# Patient Record
Sex: Male | Born: 1955 | Race: White | Hispanic: No | Marital: Married | Smoking: Former smoker
Health system: Southern US, Community
[De-identification: ages and names within clinical notes are randomized; demographics above are authoritative.]

## PROBLEM LIST (undated history)

## (undated) DIAGNOSIS — M51369 Other intervertebral disc degeneration, lumbar region without mention of lumbar back pain or lower extremity pain: Secondary | ICD-10-CM

## (undated) DIAGNOSIS — M5136 Other intervertebral disc degeneration, lumbar region: Secondary | ICD-10-CM

## (undated) DIAGNOSIS — I2510 Atherosclerotic heart disease of native coronary artery without angina pectoris: Secondary | ICD-10-CM

## (undated) DIAGNOSIS — R918 Other nonspecific abnormal finding of lung field: Secondary | ICD-10-CM

## (undated) DIAGNOSIS — E785 Hyperlipidemia, unspecified: Secondary | ICD-10-CM

## (undated) HISTORY — DX: Atherosclerotic heart disease of native coronary artery without angina pectoris: I25.10

## (undated) HISTORY — PX: COLONOSCOPY: SHX174

## (undated) HISTORY — PX: LUMBAR LAMINECTOMY: SHX95

## (undated) HISTORY — PX: CARPAL TUNNEL RELEASE: SHX101

## (undated) HISTORY — PX: TONSILLECTOMY: SUR1361

---

## 2006-01-25 ENCOUNTER — Ambulatory Visit: Payer: Self-pay | Admitting: Gastroenterology

## 2008-01-09 ENCOUNTER — Emergency Department: Payer: Self-pay

## 2008-01-09 ENCOUNTER — Other Ambulatory Visit: Payer: Self-pay

## 2008-02-04 ENCOUNTER — Ambulatory Visit: Payer: Self-pay | Admitting: Internal Medicine

## 2008-02-12 ENCOUNTER — Ambulatory Visit: Payer: Self-pay | Admitting: Unknown Physician Specialty

## 2008-02-13 ENCOUNTER — Ambulatory Visit: Payer: Self-pay | Admitting: Unknown Physician Specialty

## 2009-03-13 ENCOUNTER — Ambulatory Visit: Payer: Self-pay | Admitting: Internal Medicine

## 2012-06-13 ENCOUNTER — Other Ambulatory Visit: Payer: Self-pay | Admitting: Internal Medicine

## 2012-06-17 ENCOUNTER — Ambulatory Visit: Payer: Self-pay | Admitting: Internal Medicine

## 2012-06-19 LAB — CULTURE, BLOOD (SINGLE)

## 2013-01-05 ENCOUNTER — Ambulatory Visit: Payer: Self-pay | Admitting: Internal Medicine

## 2013-07-06 ENCOUNTER — Ambulatory Visit: Payer: Self-pay | Admitting: Internal Medicine

## 2016-03-12 ENCOUNTER — Encounter: Payer: Self-pay | Admitting: *Deleted

## 2016-03-12 ENCOUNTER — Inpatient Hospital Stay
Admission: EM | Admit: 2016-03-12 | Discharge: 2016-03-13 | DRG: 282 | Disposition: A | Discharge status: Home / Self Care | Payer: 59 | Attending: Internal Medicine | Admitting: Internal Medicine

## 2016-03-12 ENCOUNTER — Emergency Department: Payer: 59

## 2016-03-12 ENCOUNTER — Encounter
Admission: EM | Disposition: A | Discharge status: Home / Self Care | Payer: Self-pay | Source: Home / Self Care | Attending: Internal Medicine

## 2016-03-12 DIAGNOSIS — I214 Non-ST elevation (NSTEMI) myocardial infarction: Secondary | ICD-10-CM | POA: Diagnosis present

## 2016-03-12 DIAGNOSIS — E785 Hyperlipidemia, unspecified: Secondary | ICD-10-CM | POA: Diagnosis present

## 2016-03-12 DIAGNOSIS — I200 Unstable angina: Secondary | ICD-10-CM | POA: Diagnosis present

## 2016-03-12 DIAGNOSIS — Z87891 Personal history of nicotine dependence: Secondary | ICD-10-CM | POA: Diagnosis not present

## 2016-03-12 DIAGNOSIS — I10 Essential (primary) hypertension: Secondary | ICD-10-CM | POA: Diagnosis not present

## 2016-03-12 DIAGNOSIS — M5136 Other intervertebral disc degeneration, lumbar region: Secondary | ICD-10-CM | POA: Diagnosis present

## 2016-03-12 DIAGNOSIS — Z885 Allergy status to narcotic agent status: Secondary | ICD-10-CM | POA: Diagnosis not present

## 2016-03-12 DIAGNOSIS — R7989 Other specified abnormal findings of blood chemistry: Secondary | ICD-10-CM

## 2016-03-12 DIAGNOSIS — I2510 Atherosclerotic heart disease of native coronary artery without angina pectoris: Secondary | ICD-10-CM

## 2016-03-12 DIAGNOSIS — R748 Abnormal levels of other serum enzymes: Secondary | ICD-10-CM | POA: Diagnosis not present

## 2016-03-12 DIAGNOSIS — I25110 Atherosclerotic heart disease of native coronary artery with unstable angina pectoris: Secondary | ICD-10-CM | POA: Diagnosis present

## 2016-03-12 DIAGNOSIS — R778 Other specified abnormalities of plasma proteins: Secondary | ICD-10-CM

## 2016-03-12 DIAGNOSIS — I119 Hypertensive heart disease without heart failure: Secondary | ICD-10-CM | POA: Diagnosis present

## 2016-03-12 HISTORY — DX: Hyperlipidemia, unspecified: E78.5

## 2016-03-12 HISTORY — DX: Other intervertebral disc degeneration, lumbar region without mention of lumbar back pain or lower extremity pain: M51.369

## 2016-03-12 HISTORY — DX: Other intervertebral disc degeneration, lumbar region: M51.36

## 2016-03-12 HISTORY — PX: CARDIAC CATHETERIZATION: SHX172

## 2016-03-12 HISTORY — DX: Other nonspecific abnormal finding of lung field: R91.8

## 2016-03-12 LAB — BASIC METABOLIC PANEL
Anion gap: 8 (ref 5–15)
BUN: 17 mg/dL (ref 6–20)
CO2: 27 mmol/L (ref 22–32)
Calcium: 9.7 mg/dL (ref 8.9–10.3)
Chloride: 102 mmol/L (ref 101–111)
Creatinine, Ser: 1.01 mg/dL (ref 0.61–1.24)
GFR calc Af Amer: >60 mL/min (ref >60)
GFR calc non Af Amer: >60 mL/min (ref >60)
Glucose, Bld: 104 mg/dL — ABNORMAL HIGH (ref 65–99)
Potassium: 3.9 mmol/L (ref 3.5–5.1)
Sodium: 137 mmol/L (ref 135–145)

## 2016-03-12 LAB — LIPID PANEL
Cholesterol: 255 mg/dL — ABNORMAL HIGH (ref 0–200)
HDL: 39 mg/dL — ABNORMAL LOW (ref >40)
LDL Cholesterol: 152 mg/dL — ABNORMAL HIGH (ref 0–99)
Total CHOL/HDL Ratio: 6.5 RATIO
Triglycerides: 320 mg/dL — ABNORMAL HIGH (ref <150)
VLDL: 64 mg/dL — ABNORMAL HIGH (ref 0–40)

## 2016-03-12 LAB — CBC
HCT: 43.7 % (ref 40.0–52.0)
HEMATOCRIT: 43.1 % (ref 40.0–52.0)
Hemoglobin: 15.1 g/dL (ref 13.0–18.0)
Hemoglobin: 15 g/dL (ref 13.0–18.0)
MCH: 29.5 pg (ref 26.0–34.0)
MCH: 30.2 pg (ref 26.0–34.0)
MCHC: 34.6 g/dL (ref 32.0–36.0)
MCHC: 34.9 g/dL (ref 32.0–36.0)
MCV: 85.3 fL (ref 80.0–100.0)
MCV: 86.6 fL (ref 80.0–100.0)
PLATELETS: 264 10*3/uL (ref 150–440)
Platelets: 257 10*3/uL (ref 150–440)
RBC: 5.12 MIL/uL (ref 4.40–5.90)
RBC: 4.98 MIL/uL (ref 4.40–5.90)
RDW: 13 % (ref 11.5–14.5)
RDW: 12.9 % (ref 11.5–14.5)
WBC: 12.5 10*3/uL — ABNORMAL HIGH (ref 3.8–10.6)
WBC: 12.2 10*3/uL — AB (ref 3.8–10.6)

## 2016-03-12 LAB — TROPONIN I
TROPONIN I: 3.78 ng/mL — AB (ref <0.03)
Troponin I: 0.06 ng/mL (ref <0.03)

## 2016-03-12 LAB — PROTIME-INR
INR: 0.95
PROTHROMBIN TIME: 12.7 s (ref 11.4–15.2)

## 2016-03-12 LAB — CREATININE, SERUM: CREATININE: 1.05 mg/dL (ref 0.61–1.24)

## 2016-03-12 LAB — APTT: APTT: 30 s (ref 24–36)

## 2016-03-12 SURGERY — LEFT HEART CATH AND CORONARY ANGIOGRAPHY
Anesthesia: Moderate Sedation

## 2016-03-12 MED ORDER — MIDAZOLAM HCL 2 MG/2ML IJ SOLN
INTRAMUSCULAR | Status: AC
  Filled 2016-03-12: qty 2

## 2016-03-12 MED ORDER — ASPIRIN EC 81 MG PO TBEC
81.0000 mg | DELAYED_RELEASE_TABLET | Freq: Every day | ORAL | Status: DC
  Administered 2016-03-13: 81 mg via ORAL
  Filled 2016-03-12: qty 1

## 2016-03-12 MED ORDER — CLOPIDOGREL BISULFATE 75 MG PO TABS
75.0000 mg | ORAL_TABLET | Freq: Every day | ORAL | Status: DC
  Administered 2016-03-13: 75 mg via ORAL
  Filled 2016-03-12: qty 1

## 2016-03-12 MED ORDER — HEPARIN (PORCINE) IN NACL 100-0.45 UNIT/ML-% IJ SOLN
850.0000 [IU]/h | INTRAMUSCULAR | Status: DC
  Administered 2016-03-12: 850 [IU]/h via INTRAVENOUS
  Filled 2016-03-12: qty 250

## 2016-03-12 MED ORDER — HEPARIN SODIUM (PORCINE) 1000 UNIT/ML IJ SOLN
INTRAMUSCULAR | Status: AC
  Filled 2016-03-12: qty 1

## 2016-03-12 MED ORDER — HEPARIN (PORCINE) IN NACL 2-0.9 UNIT/ML-% IJ SOLN
INTRAMUSCULAR | Status: AC
  Filled 2016-03-12: qty 500

## 2016-03-12 MED ORDER — FENTANYL CITRATE (PF) 100 MCG/2ML IJ SOLN
INTRAMUSCULAR | Status: AC
  Filled 2016-03-12: qty 2

## 2016-03-12 MED ORDER — SODIUM CHLORIDE 0.9 % IV SOLN
INTRAVENOUS | Status: DC
  Administered 2016-03-13 (×2): via INTRAVENOUS

## 2016-03-12 MED ORDER — SODIUM CHLORIDE 0.9% FLUSH
3.0000 mL | Freq: Two times a day (BID) | INTRAVENOUS | Status: DC
  Administered 2016-03-12 – 2016-03-13 (×2): 3 mL via INTRAVENOUS

## 2016-03-12 MED ORDER — HEPARIN BOLUS VIA INFUSION
4000.0000 [IU] | Freq: Once | INTRAVENOUS | Status: AC
  Administered 2016-03-12: 4000 [IU] via INTRAVENOUS
  Filled 2016-03-12: qty 4000

## 2016-03-12 MED ORDER — MIDAZOLAM HCL 2 MG/2ML IJ SOLN
INTRAMUSCULAR | Status: DC | PRN
  Administered 2016-03-12: 1 mg via INTRAVENOUS

## 2016-03-12 MED ORDER — ATORVASTATIN CALCIUM 20 MG PO TABS: 40.0000 mg | ORAL_TABLET | Freq: Every day | ORAL | Status: DC

## 2016-03-12 MED ORDER — MORPHINE SULFATE (PF) 4 MG/ML IV SOLN: 2.0000 mg | Freq: Four times a day (QID) | INTRAVENOUS | Status: DC | PRN

## 2016-03-12 MED ORDER — NITROGLYCERIN 2 % TD OINT
0.5000 [in_us] | TOPICAL_OINTMENT | Freq: Four times a day (QID) | TRANSDERMAL | Status: DC
  Administered 2016-03-12: 0.5 [in_us] via TOPICAL
  Filled 2016-03-12: qty 1

## 2016-03-12 MED ORDER — ENOXAPARIN SODIUM 40 MG/0.4ML ~~LOC~~ SOLN: 40.0000 mg | SUBCUTANEOUS | Status: DC

## 2016-03-12 MED ORDER — IOPAMIDOL (ISOVUE-300) INJECTION 61%
INTRAVENOUS | Status: DC | PRN
  Administered 2016-03-12: 105 mL via INTRA_ARTERIAL

## 2016-03-12 MED ORDER — ACETAMINOPHEN 325 MG PO TABS: 650.0000 mg | ORAL_TABLET | Freq: Four times a day (QID) | ORAL | Status: DC | PRN

## 2016-03-12 MED ORDER — SODIUM CHLORIDE 0.9% FLUSH: 3.0000 mL | INTRAVENOUS | Status: DC | PRN

## 2016-03-12 MED ORDER — ASPIRIN 81 MG PO CHEW
324.0000 mg | CHEWABLE_TABLET | Freq: Once | ORAL | Status: AC
  Administered 2016-03-12: 324 mg via ORAL
  Filled 2016-03-12: qty 4

## 2016-03-12 MED ORDER — ATORVASTATIN CALCIUM 20 MG PO TABS
40.0000 mg | ORAL_TABLET | Freq: Every day | ORAL | Status: DC
Start: 2016-03-13 — End: 2016-03-12

## 2016-03-12 MED ORDER — FENTANYL CITRATE (PF) 100 MCG/2ML IJ SOLN
INTRAMUSCULAR | Status: DC | PRN
  Administered 2016-03-12: 50 ug via INTRAVENOUS

## 2016-03-12 MED ORDER — ONDANSETRON HCL 4 MG PO TABS
4.0000 mg | ORAL_TABLET | Freq: Four times a day (QID) | ORAL | Status: DC | PRN
Start: 2016-03-12 — End: 2016-03-13

## 2016-03-12 MED ORDER — VERAPAMIL HCL 2.5 MG/ML IV SOLN
INTRAVENOUS | Status: AC
  Filled 2016-03-12: qty 2

## 2016-03-12 MED ORDER — ACETAMINOPHEN 650 MG RE SUPP: 650.0000 mg | Freq: Four times a day (QID) | RECTAL | Status: DC | PRN

## 2016-03-12 MED ORDER — SODIUM CHLORIDE 0.9 % IV SOLN: 250.0000 mL | INTRAVENOUS | Status: DC | PRN

## 2016-03-12 MED ORDER — ONDANSETRON HCL 4 MG/2ML IJ SOLN: 4.0000 mg | Freq: Four times a day (QID) | INTRAMUSCULAR | Status: DC | PRN

## 2016-03-12 MED ORDER — CLOPIDOGREL BISULFATE 75 MG PO TABS
300.0000 mg | ORAL_TABLET | Freq: Once | ORAL | Status: AC
  Administered 2016-03-12: 300 mg via ORAL
  Filled 2016-03-12: qty 4

## 2016-03-12 MED ORDER — METOPROLOL TARTRATE 25 MG PO TABS
25.0000 mg | ORAL_TABLET | Freq: Two times a day (BID) | ORAL | Status: DC
  Administered 2016-03-12 – 2016-03-13 (×2): 25 mg via ORAL
  Filled 2016-03-12 (×2): qty 1

## 2016-03-12 MED ORDER — OXYCODONE HCL 5 MG PO TABS: 5.0000 mg | ORAL_TABLET | ORAL | Status: DC | PRN

## 2016-03-12 MED ORDER — SODIUM CHLORIDE 0.9 % IV SOLN
INTRAVENOUS | Status: DC
  Administered 2016-03-12: 19:00:00 via INTRAVENOUS

## 2016-03-12 SURGICAL SUPPLY — 7 items
CATH 5FR PIGTAIL DIAGNOSTIC (CATHETERS) ×3 IMPLANT
CATH OPTITORQUE JACKY 4.0 5F (CATHETERS) ×3 IMPLANT
DEVICE RAD TR BAND REGULAR (VASCULAR PRODUCTS) ×3 IMPLANT
GLIDESHEATH SLEND SS 6F .021 (SHEATH) ×3 IMPLANT
KIT MANI 3VAL PERCEP (MISCELLANEOUS) ×3 IMPLANT
PACK CARDIAC CATH (CUSTOM PROCEDURE TRAY) ×3 IMPLANT
WIRE ROSEN-J .035X260CM (WIRE) ×3 IMPLANT

## 2016-03-12 NOTE — H&P (Signed)
[employer] [facility] [place] [facility]   PATIENT NAME: Matthew Arias    MR#:  [id number]  DATE OF BIRTH:  [birth year]  DATE OF ADMISSION:  03/12/2016  PRIMARY CARE PHYSICIAN: Lynnea FerrierBERT J KLEIN III, MD   REQUESTING/REFERRING PHYSICIAN: Dr. Dorothea GlassmanPaul Malinda  CHIEF COMPLAINT:   Chief Complaint  Patient presents with  . Chest Pain    HISTORY OF PRESENT ILLNESS:  Matthew Arias  is a 60 y.o. male with a known history of Lumbar degenerative disc disease, hyperlipidemia not on any medications at home presents to hospital secondary to chest pain. Patient is very active at baseline, he said he was driving to the beach 2 days ago and suddenly had significant left-sided chest pain radiating down to the left shoulder, 10/10 and lasted for more than 30 minutes. He said he had to turn back and drive back home that night. It eased off after a little while. He called his family and they thought it was maybe secondary to gas and since symptoms subsided he stayed home. It was not associated with any nausea or vomiting. Patient doesn't have any known cardiac history. No chest pain with recent exercise activities as well. This afternoon he was playing with his grandson and suddenly had similar chest pain that made him double over and presented to the emergency room. EKG showing lateral ST depressions when compared to his old EKG from 8 years ago. Troponin is borderline elevated at 0.06. Patient still has 4/10 chest pain at this time. He is being admitted for possible NSTEMI  PAST MEDICAL HISTORY:   Past Medical History:  Diagnosis Date  . Degenerative disc disease, lumbar   . Hyperlipidemia   . Pulmonary nodules     PAST SURGICAL HISTORY:   Past Surgical History:  Procedure Laterality Date  . CARPAL TUNNEL RELEASE    . COLONOSCOPY    . LUMBAR LAMINECTOMY    . TONSILLECTOMY      SOCIAL HISTORY:   Social History  Substance Use Topics  . Smoking status: Former Smoker    Types: Cigarettes   Quit date: 04/24/2015  . Smokeless tobacco: Not on file     Comment: used to smoke 1PPD for 40 years  . Alcohol use Yes     Comment: occasional    FAMILY HISTORY:   Family History  Problem Relation Age of Onset  . Arthritis Mother   . CAD Father     DRUG ALLERGIES:  No Known Allergies  REVIEW OF SYSTEMS:   Review of Systems  Constitutional: Negative for chills, fever, malaise/fatigue and weight loss.  HENT: Negative for ear discharge, ear pain, hearing loss, nosebleeds and tinnitus.   Eyes: Negative for blurred vision, double vision and photophobia.  Respiratory: Negative for cough, hemoptysis, shortness of breath and wheezing.   Cardiovascular: Positive for chest pain. Negative for palpitations, orthopnea and leg swelling.  Gastrointestinal: Negative for abdominal pain, constipation, diarrhea, heartburn, melena, nausea and vomiting.  Genitourinary: Negative for dysuria, frequency, hematuria and urgency.  Musculoskeletal: Negative for back pain, myalgias and neck pain.  Skin: Negative for rash.  Neurological: Negative for dizziness, tingling, tremors, sensory change, speech change, focal weakness and headaches.  Endo/Heme/Allergies: Does not bruise/bleed easily.  Psychiatric/Behavioral: Negative for depression.    MEDICATIONS AT HOME:   Prior to Admission medications   Not on File      VITAL SIGNS:  Blood pressure (!) 157/85, pulse 79, resp. rate (!) 22, height 5\' 4"  (1.626 m), weight 70.3  kg (155 lb).  PHYSICAL EXAMINATION:   Physical Exam  GENERAL:  60 y.o.-year-old patient lying in the bed with no acute distress.  EYES: Pupils equal, round, reactive to light and accommodation. No scleral icterus. Extraocular muscles intact.  HEENT: Head atraumatic, normocephalic. Oropharynx and nasopharynx clear.  NECK:  Supple, no jugular venous distention. No thyroid enlargement, no tenderness.  LUNGS: Normal breath sounds bilaterally, no wheezing, rales,rhonchi or crepitation.  No use of accessory muscles of respiration.  CARDIOVASCULAR: S1, S2 normal. No murmurs, rubs, or gallops.  ABDOMEN: Soft, nontender, nondistended. Bowel sounds present. No organomegaly or mass.  EXTREMITIES: No pedal edema, cyanosis, or clubbing.  NEUROLOGIC: Cranial nerves II through XII are intact. Muscle strength 5/5 in all extremities. Sensation intact. Gait not checked.  PSYCHIATRIC: The patient is alert and oriented x 3.  SKIN: No obvious rash, lesion, or ulcer.   LABORATORY PANEL:   CBC  Recent Labs Lab 03/12/16 1403  WBC 12.5*  HGB 15.1  HCT 43.7  PLT 264   ------------------------------------------------------------------------------------------------------------------  Chemistries   Recent Labs Lab 03/12/16 1403  NA 137  K 3.9  CL 102  CO2 27  GLUCOSE 104*  BUN 17  CREATININE 1.01  CALCIUM 9.7   ------------------------------------------------------------------------------------------------------------------  Cardiac Enzymes  Recent Labs Lab 03/12/16 1403  TROPONINI 0.06*   ------------------------------------------------------------------------------------------------------------------  RADIOLOGY:  Dg Chest 2 View  Result Date: 03/12/2016 CLINICAL DATA:  Intermittent left-sided chest pain since Friday night. Former smoker. EXAM: CHEST  2 VIEW COMPARISON:  CT scan of the chest dated July 06, 2013, report of a chest x-ray of April 14, 2015. FINDINGS: The lungs are adequately inflated and clear. The Arias and pulmonary vascularity are normal. No pulmonary parenchymal nodules or masses are observed. The mediastinum is normal in width. There is no pleural effusion. The bony thorax is unremarkable. IMPRESSION: There is no active cardiopulmonary disease. If the patient's symptoms persist and remain unexplained, chest CT scanning would be a useful next imaging step. Electronically Signed   By: David  SwazilandJordan M.D.   On: 03/12/2016 14:36    EKG:   Orders  placed or performed during the hospital encounter of 03/12/16  . ED EKG within 10 minutes  . ED EKG within 10 minutes  . EKG 12-Lead  . EKG 12-Lead    IMPRESSION AND PLAN:   Matthew Arias  is a 60 y.o. male with a known history of Lumbar degenerative disc disease, hyperlipidemia not on any medications at home presents to hospital secondary to chest pain.  #1 NSTEMI- typical chest pain, EKG changes and slightly elevated troponin - admit to telemetry, ECHO - asa, nitro paste. Cards consult - started on heparin drip, oral metoprolol and statin. Check a1c and lipid profile - NPO after midnight in case needs cath - follow troponins  #2 HTN- no h/o HTN, BP elevated now Started on metoprolol  #3 Lumbar disc disease- recent prednisone taper, feels better No active complaints  #4 DVT Prophylaxis- on heparin drip    All the records are reviewed and case discussed with ED provider. Management plans discussed with the patient, family and they are in agreement.  CODE STATUS: Full Code  TOTAL TIME TAKING CARE OF THIS PATIENT: 50 minutes.    Enid BaasKALISETTI,Braidon Chermak M.D on 03/12/2016 at 3:21 PM  Between 7am to 6pm - Pager - [phone]  After 6pm go to [email or web address] - Social research officer, [email or web address] [email or web address]  [facility]  Office  [phone]  CC: Primary care physician; Lynnea FerrierBERT J KLEIN III, MD

## 2016-03-12 NOTE — Progress Notes (Signed)
Pt remains without complaints, to stay overnight on telemetry, sinus per monitor, report called to care nurse for 245 with plan reviewed.vitals stable,.

## 2016-03-12 NOTE — Progress Notes (Signed)
Pt post heart cath, with medical management per Dr Kirke CorinArida, tr band intact, no visible bleeding noted at this time,denies chest pain,  Sinus rhythm per monitor, vitals stable, Dr Kirke CorinArida out to speak with pt with questions answered,

## 2016-03-12 NOTE — ED Triage Notes (Signed)
States 2 episodes of chest pain, left sided up into his shoulder, states dull ache, pt awake and alert in no acute distress

## 2016-03-12 NOTE — ED Provider Notes (Signed)
[facility] Emergency Department Provider Note   ____________________________________________   First MD Initiated Contact with Patient 03/12/16 1430     (approximate)  I have reviewed the triage vital signs and the nursing notes.   HISTORY  Chief Complaint Chest Pain   HPI Matthew Arias is a 60 y.o. male who reports she's had 2 episodes of chest pain one was while he was driving to the beach on Friday he had an achy pressure-like pain in his left chest went away by the time he got back home. The second one was today between 10 and 11:00 the same kind of pain in his left chest and left shoulder and also in his neck he had no shortness of breath with that he did vomit once had no sweats. Thing he did seem to make the pain better or worse. He reports he generally skips rope every day until he gets winded and then place pool until he feels better he has never had trouble skipping rope and developing any chest pain. Pain was moderate in intensity.  History reviewed. No pertinent past medical history.  There are no active problems to display for this patient.   No past surgical history on file.  Prior to Admission medications   Not on File    Allergies Patient has no known allergies.  History reviewed. No pertinent family history.  Social History Social History  Substance Use Topics  . Smoking status: Not on file  . Smokeless tobacco: Not on file  . Alcohol use Not on file  Patient reports he quit smoking after 45 years on January 1  Review of Systems }Constitutional: No fever/chills Eyes: No visual changes. ENT: No sore throat. Cardiovascular: See history of present illness. Respiratory: Denies shortness of breath. Gastrointestinal: No abdominal pain.  No nausea, vomited once today.  No diarrhea.  No constipation. Genitourinary: Negative for dysuria. Musculoskeletal: Negative for back pain. Skin: Negative for rash. Neurological: Negative for  headaches, focal weakness or numbness.  10-point ROS otherwise negative.  ____________________________________________   PHYSICAL EXAM:  VITAL SIGNS: ED Triage Vitals  Enc Vitals Group     BP --      Pulse --      Resp --      Temp --      Temp src --      SpO2 --      Weight 03/12/16 1401 155 lb (70.3 kg)     Height 03/12/16 1401 5\' 4"  (1.626 m)     Head Circumference --      Peak Flow --      Pain Score 03/12/16 1402 6     Pain Loc --      Pain Edu? --      Excl. in GC? --     Constitutional: Alert and oriented. Well appearing and in no acute distress. Eyes: Conjunctivae are normal. PERRL. EOMI. Head: Atraumatic. Nose: No congestion/rhinnorhea. Mouth/Throat: Mucous membranes are moist.  Oropharynx non-erythematous. Neck: No stridor.   Cardiovascular: Normal rate, regular rhythm. Grossly normal heart sounds.  Good peripheral circulation. Respiratory: Normal respiratory effort.  No retractions. Lungs CTAB. Gastrointestinal: Soft and nontender. No distention. No abdominal bruits. No CVA tenderness. Musculoskeletal: No lower extremity tenderness nor edema.  No joint effusions.   ____________________________________________   LABS (all labs ordered are listed, but only abnormal results are displayed)  Labs Reviewed  BASIC METABOLIC PANEL - Abnormal; Notable for the following:  Result Value   Glucose, Bld 104 (*)    All other components within normal limits  CBC - Abnormal; Notable for the following:    WBC 12.5 (*)    All other components within normal limits  TROPONIN I - Abnormal; Notable for the following:    Troponin I 0.06 (*)    All other components within normal limits   ____________________________________________  EKG  EKG read and interpreted by me shows normal sinus rhythm rate of 93 normal axis patient has some ST flattening and slight depression even downsloping in leads 3 and aVF. He also has thing in V3 and V4 these are all new from a  previous EKG earlier this year. ____________________________________________  RADIOLOGY   Chest x-ray is pending ____________________________________________   PROCEDURES  Procedure(s) performed:   Procedures  Critical Care performed  ____________________________________________   INITIAL IMPRESSION / ASSESSMENT AND PLAN / ED COURSE  Pertinent labs & imaging results that were available during my care of the patient were reviewed by me and considered in my medical decision making (see chart for details).    Clinical Course    Patient's EKG is abnormal troponin is elevated we'll admit the patient  ____________________________________________   FINAL CLINICAL IMPRESSION(S) / ED DIAGNOSES  Final diagnoses:  Unstable angina (HCC)  Elevated troponin      NEW MEDICATIONS STARTED DURING THIS VISIT:  New Prescriptions   No medications on file     Note:  This document was prepared using Dragon voice recognition software and may include unintentional dictation errors.    Arnaldo NatalPaul F Malinda, MD 03/12/16 [phone]

## 2016-03-12 NOTE — Consult Note (Signed)
Cardiology Consultation Note  Patient ID: Matthew Arias, MRN: [id number], DOB/AGE: [birth year] 62 y.o. Admit date: 03/12/2016   Date of Consult: 03/12/2016 Primary Physician: Lynnea FerrierBERT J KLEIN III, MD Primary Cardiologist: [facility] Requesting Physician: Dr. Nemiah CommanderKalisetti, MD  Chief Complaint: Chest pain Reason for Consult: NSTEMI  HPI: 60 y.o. male with h/o diet controlled HLD and chronic back pain s/p lumbar fusion who presented with stuttering left-sided chest pain and was found to have an initial troponin of 0.06 with new EKG changes c/w ischemia.   He does not have any previously known cardiac history though previously underwent stress testing with Dr. Lady GaryFath, MD in 2009 for chest pain with patient reported normal results. No prior cardiac cath. Patient reported developed left-sided chest pain on 11/17 while driving to the beach. Pain was described as a "squeeze" sensation. He attempted to take some TUMs and [facility] [employer] without relief. Because his pain persisted he decided to turn around and come back home. Total duration of pain on 11/17 was approximately 1 hour with self resoluation. On 11/18 and 11/19 he was asymptomatic. Able to perform his usual daily activities and jump rope like he usually does everyday without any issues. This morning he was sitting on the floor with his grand kid when he redeveloped left-sided chest pain that again was described as a "squueze." Pain was initially rated 10/10 and radiated to the left side of his neck and left shoulder. There was one episode of emesis. He has never had chest pain like this before.   Patient reports a long history of reflux and vomiting when eating pork products and had since cut them out several years ago with improvement in symptoms. This morning he did have some sausage balls followed by episode of emesis as above.   [facility] they were found to have an initial troponin of 0.06, WBC 12.5, HGB 15.1, PLT 264, SCr 1.01, K+  3.9. ECG showed NSR, 93 bpm 1 mm horizontal st depression leads III, aVF, V4-V5, CXR showed no active cardiopulmonary process. He was started on heparin gtt, received full-dose ASA in the ED, and nitro paste along with beta blocker have been ordered. His pain has improved from a 10/10 to a current level of 4/10.    Past Medical History:  Diagnosis Date  . Degenerative disc disease, lumbar   . Hyperlipidemia   . Pulmonary nodules       Most Recent Cardiac Studies: none   Surgical History:  Past Surgical History:  Procedure Laterality Date  . CARPAL TUNNEL RELEASE    . COLONOSCOPY    . LUMBAR LAMINECTOMY    . TONSILLECTOMY       Home Meds: Prior to Admission medications   Not on File    Inpatient Medications:     Allergies: No Known Allergies  Social History   Social History  . Marital status: Married    Spouse name: N/A  . Number of children: N/A  . Years of education: N/A   Occupational History  . Not on file.   Social History Main Topics  . Smoking status: Former Smoker    Types: Cigarettes    Quit date: 04/24/2015  . Smokeless tobacco: Not on file     Comment: used to smoke 1PPD for 40 years  . Alcohol use Yes     Comment: occasional  . Drug use: No  . Sexual activity: Not on file   Other Topics Concern  .  Not on file   Social History Narrative   Independent. Lives at home with family. Very active at baseline     Family History  Problem Relation Age of Onset  . Arthritis Mother   . CAD Father      Review of Systems: Review of Systems  Constitutional: Positive for malaise/fatigue. Negative for chills, diaphoresis, fever and weight loss.  HENT: Negative for congestion.   Eyes: Negative for discharge and redness.  Respiratory: Negative for cough, hemoptysis, sputum production, shortness of breath and wheezing.   Cardiovascular: Positive for chest pain. Negative for palpitations, orthopnea, claudication, leg swelling and PND.  Gastrointestinal:  Positive for nausea and vomiting. Negative for abdominal pain, blood in stool, heartburn and melena.  Genitourinary: Negative for hematuria.  Musculoskeletal: Negative for falls and myalgias.  Skin: Negative for rash.  Neurological: Positive for weakness. Negative for dizziness, tingling, tremors, sensory change, speech change, focal weakness and loss of consciousness.  Endo/Heme/Allergies: Does not bruise/bleed easily.  Psychiatric/Behavioral: Negative for substance abuse. The patient is not nervous/anxious.   All other systems reviewed and are negative.   Labs:  Recent Labs  03/12/16 1403  TROPONINI 0.06*   Lab Results  Component Value Date   WBC 12.5 (H) 03/12/2016   HGB 15.1 03/12/2016   HCT 43.7 03/12/2016   MCV 85.3 03/12/2016   PLT 264 03/12/2016     Recent Labs Lab 03/12/16 1403  NA 137  K 3.9  CL 102  CO2 27  BUN 17  CREATININE 1.01  CALCIUM 9.7  GLUCOSE 104*   Lab Results  Component Value Date   CHOL 255 (H) 03/12/2016   HDL 39 (L) 03/12/2016   LDLCALC 152 (H) 03/12/2016   TRIG 320 (H) 03/12/2016   No results found for: DDIMER  Radiology/Studies:  Dg Chest 2 View  Result Date: 03/12/2016 CLINICAL DATA:  Intermittent left-sided chest pain since Friday night. Former smoker. EXAM: CHEST  2 VIEW COMPARISON:  CT scan of the chest dated July 06, 2013, report of a chest x-ray of April 14, 2015. FINDINGS: The lungs are adequately inflated and clear. The heart and pulmonary vascularity are normal. No pulmonary parenchymal nodules or masses are observed. The mediastinum is normal in width. There is no pleural effusion. The bony thorax is unremarkable. IMPRESSION: There is no active cardiopulmonary disease. If the patient's symptoms persist and remain unexplained, chest CT scanning would be a useful next imaging step. Electronically Signed   By: David  SwazilandJordan M.D.   On: 03/12/2016 14:36    EKG: Interpreted by me showed: NSR, 93 bpm 1 mm horizontal st  depression leads III, aVF, V4-V5 Telemetry: Interpreted by me showed: NSR, 80's bpm  Weights: Filed Weights   03/12/16 1401  Weight: 155 lb (70.3 kg)     Physical Exam: Blood pressure (!) 157/85, pulse 79, resp. rate (!) 22, height 5\' 4"  (1.626 m), weight 155 lb (70.3 kg). Body mass index is 26.61 kg/m. General: Well developed, well nourished, in no acute distress. Head: Normocephalic, atraumatic, sclera non-icteric, no xanthomas, nares are without discharge.  Neck: Negative for carotid bruits. JVD not elevated. Lungs: Clear bilaterally to auscultation without wheezes, rales, or rhonchi. Breathing is unlabored. Heart: RRR with S1 S2. No murmurs, rubs, or gallops appreciated. Palpation does not reproduce pain.  Abdomen: Soft, non-tender, non-distended with normoactive bowel sounds. No hepatomegaly. No rebound/guarding. No obvious abdominal masses. Msk:  Strength and tone appear normal for age. Extremities: No clubbing or cyanosis. No edema. Distal pedal  pulses are 2+ and equal bilaterally. Neuro: Alert and oriented X 3. No facial asymmetry. No focal deficit. Moves all extremities spontaneously. Psych:  Responds to questions appropriately with a normal affect.    Assessment and Plan:  Principal Problem:   NSTEMI (non-ST elevated myocardial infarction) (HCC) Active Problems:   Accelerated hypertension    1. NSTEMI: -Initial troponin 0.06, EKG with new st/t changes along inferolateral leads -Continue to cycle troponin levels to peak -Heparin gtt -Received full-dose ASA in the ED -Pain improved from 10/10 to current level of 4/10 -Nitro paste -Lopressor -ASA -PRN morphine -Add high-intensity statin -Schedule for cardiac cath with Dr. Kirke Corin this afternoon given EKG changes -Risks and benefits of cardiac catheterization have been discussed with the patient including risks of bleeding, bruising, infection, kidney damage, stroke, heart attack, and death. The patient understands  these risks and is willing to proceed with the procedure. All questions have been answered and concerns listened to -Check echo -Check fasting lipid and A1C for risk stratification   2. Accelerated hypertension: -BP usually well controlled in the 1-teens systolic -Likely in the setting of the above -Lopressor added as above  3. HLD: -Diet managed as an outpatient  -Check lipid panel as above   Signed, Eula Listen, PA-C [facility] Pager: [phone] 03/12/2016, 3:48 PM

## 2016-03-12 NOTE — Consult Note (Signed)
ANTICOAGULATION CONSULT NOTE - Initial Consult  Pharmacy Consult for heparin drip Indication: chest pain/ACS  No Known Allergies  Patient Measurements: Height: 5\' 4"  (162.6 cm) Weight: 155 lb (70.3 kg) IBW/kg (Calculated) : 59.2 Heparin Dosing Weight: 70.3kg  Vital Signs: BP: 157/85 (11/20 1500) Pulse Rate: 79 (11/20 1500)  Labs:  Recent Labs  03/12/16 1403  HGB 15.1  HCT 43.7  PLT 264  CREATININE 1.01  TROPONINI 0.06*    Estimated Creatinine Clearance: 64.3 mL/min (by C-G formula based on SCr of 1.01 mg/dL).   Medical History: Past Medical History:  Diagnosis Date  . Degenerative disc disease, lumbar   . Hyperlipidemia   . Pulmonary nodules     Medications:  Scheduled:  . heparin  4,000 Units Intravenous Once    Assessment: Pt is a 60 year old male who presents with 2 episodes of chest pain and elevated troponin. Baseline INR and APTT added on to labs. Baseline CBC WNL. Care Everywhere reveals no home anticoagulation.   Goal of Therapy:  Heparin level 0.3-0.7 units/ml Monitor platelets by anticoagulation protocol: Yes   Plan:  Give 4000 units bolus x 1 Start heparin infusion at 850 units/hr Check anti-Xa level in 6 hours and daily while on heparin Continue to monitor H&H and platelets  Zameria Vogl D Avabella Wailes 03/12/2016,3:14 PM

## 2016-03-13 ENCOUNTER — Telehealth: Payer: Self-pay | Admitting: Physician Assistant

## 2016-03-13 ENCOUNTER — Encounter
Admission: EM | Disposition: A | Discharge status: Home / Self Care | Payer: Self-pay | Source: Home / Self Care | Attending: Internal Medicine

## 2016-03-13 ENCOUNTER — Encounter: Payer: Self-pay | Admitting: Cardiovascular Disease

## 2016-03-13 DIAGNOSIS — I200 Unstable angina: Secondary | ICD-10-CM

## 2016-03-13 DIAGNOSIS — I214 Non-ST elevation (NSTEMI) myocardial infarction: Principal | ICD-10-CM

## 2016-03-13 DIAGNOSIS — R778 Other specified abnormalities of plasma proteins: Secondary | ICD-10-CM

## 2016-03-13 DIAGNOSIS — R748 Abnormal levels of other serum enzymes: Secondary | ICD-10-CM

## 2016-03-13 DIAGNOSIS — R7989 Other specified abnormal findings of blood chemistry: Secondary | ICD-10-CM

## 2016-03-13 DIAGNOSIS — I10 Essential (primary) hypertension: Secondary | ICD-10-CM

## 2016-03-13 LAB — CBC
HCT: 40.7 % (ref 40.0–52.0)
HEMOGLOBIN: 14.1 g/dL (ref 13.0–18.0)
MCH: 29.4 pg (ref 26.0–34.0)
MCHC: 34.7 g/dL (ref 32.0–36.0)
MCV: 84.7 fL (ref 80.0–100.0)
PLATELETS: 230 10*3/uL (ref 150–440)
RBC: 4.8 MIL/uL (ref 4.40–5.90)
RDW: 12.9 % (ref 11.5–14.5)
WBC: 12.7 10*3/uL — AB (ref 3.8–10.6)

## 2016-03-13 LAB — BASIC METABOLIC PANEL
ANION GAP: 6 (ref 5–15)
BUN: 12 mg/dL (ref 6–20)
CALCIUM: 8.6 mg/dL — AB (ref 8.9–10.3)
CHLORIDE: 106 mmol/L (ref 101–111)
CO2: 24 mmol/L (ref 22–32)
CREATININE: 1.01 mg/dL (ref 0.61–1.24)
GFR calc non Af Amer: >60 mL/min (ref >60)
Glucose, Bld: 136 mg/dL — ABNORMAL HIGH (ref 65–99)
Potassium: 3.9 mmol/L (ref 3.5–5.1)
SODIUM: 136 mmol/L (ref 135–145)

## 2016-03-13 LAB — HEMOGLOBIN A1C
Hgb A1c MFr Bld: 5.7 % — ABNORMAL HIGH (ref 4.8–5.6)
MEAN PLASMA GLUCOSE: 117 mg/dL

## 2016-03-13 LAB — TROPONIN I
TROPONIN I: 25.8 ng/mL — AB (ref <0.03)
Troponin I: 20.76 ng/mL (ref <0.03)

## 2016-03-13 SURGERY — LEFT HEART CATH AND CORONARY ANGIOGRAPHY
Anesthesia: Moderate Sedation

## 2016-03-13 MED ORDER — ASPIRIN 81 MG PO TBEC: 81.0000 mg | DELAYED_RELEASE_TABLET | Freq: Every day | ORAL | Status: AC

## 2016-03-13 MED ORDER — ATORVASTATIN CALCIUM 40 MG PO TABS: 40.0000 mg | ORAL_TABLET | Freq: Every day | ORAL | 0 refills | Status: DC

## 2016-03-13 MED ORDER — NITROGLYCERIN 0.4 MG SL SUBL: 0.4000 mg | SUBLINGUAL_TABLET | SUBLINGUAL | Status: DC | PRN

## 2016-03-13 MED ORDER — NITROGLYCERIN 0.4 MG SL SUBL: 0.4000 mg | SUBLINGUAL_TABLET | SUBLINGUAL | 0 refills | Status: DC | PRN

## 2016-03-13 MED ORDER — METOPROLOL TARTRATE 25 MG PO TABS: 25.0000 mg | ORAL_TABLET | Freq: Two times a day (BID) | ORAL | 0 refills | Status: DC

## 2016-03-13 MED ORDER — CLOPIDOGREL BISULFATE 75 MG PO TABS: 75.0000 mg | ORAL_TABLET | Freq: Every day | ORAL | 0 refills | Status: DC

## 2016-03-13 NOTE — Progress Notes (Signed)
Pt discharged to home via wc.  Instructions and rx given to pt.  Questions answered.  No distress.  

## 2016-03-13 NOTE — Progress Notes (Signed)
Patient Name: Matthew Arias Date of Encounter: 03/13/2016  Primary Cardiologist: new (seen by Dr. Kirke CorinArida in consult)  Hospital Problem List     Principal Problem:   NSTEMI (non-ST elevated myocardial infarction) [facility](HCC) Active Problems:   Accelerated hypertension     Subjective   Cardiac cath on 11/20 showed severe one-vessel CAD with an occluded D1 felt to be the culprit lesion. The disease extended all the way back to the ostium with faint collaterals already starting to form. Vessel diameter was approximately 2 mm. Given all of the above risks of PCU outweighed the benefits and medical management was advised. No further chest pain since around midnight. Has ambulated in his room without issues. Troponin up to 25 at this time with level pending this morning.   Inpatient Medications    Scheduled Meds: . aspirin EC  81 mg Oral Daily  . atorvastatin  40 mg Oral q1800  . clopidogrel  75 mg Oral Q breakfast  . enoxaparin (LOVENOX) injection  40 mg Subcutaneous Q24H  . metoprolol tartrate  25 mg Oral BID  . nitroGLYCERIN  0.5 inch Topical Q6H  . sodium chloride flush  3 mL Intravenous Q12H   Continuous Infusions: . sodium chloride 75 mL/hr at 03/13/16 0124   PRN Meds: sodium chloride, acetaminophen **OR** acetaminophen, morphine injection, ondansetron **OR** ondansetron (ZOFRAN) IV, oxyCODONE, sodium chloride flush   Vital Signs    Vitals:   03/12/16 1838 03/12/16 1924 03/12/16 2130 03/13/16 0453  BP: (!) 156/83 (!) 153/75 (!) 149/84 113/67  Pulse: 78 78 71 75  Resp: 18 18  18   Temp: 98 F (36.7 C) 97.9 F (36.6 C)  98 F (36.7 C)  TempSrc: Oral Oral  Oral  SpO2: 98% 99%  95%  Weight:      Height:        Intake/Output Summary (Last 24 hours) at 03/13/16 0737 Last data filed at 03/13/16 0539  Gross per 24 hour  Intake           728.75 ml  Output             1025 ml  Net          -296.25 ml   Filed Weights   03/12/16 1401 03/12/16 1615  Weight: 155 lb (70.3 kg) 155  lb (70.3 kg)    Physical Exam    GEN: Well nourished, well developed, in no acute distress.  HEENT: Grossly normal.  Neck: Supple, no JVD, carotid bruits, or masses. Cardiac: RRR, no murmurs, rubs, or gallops. No clubbing, cyanosis, edema.  Radials/DP/PT 2+ and equal bilaterally. Cardiac cath without bleeding, bruising, swelling, erythema, or TTP.  Respiratory:  Respirations regular and unlabored, clear to auscultation bilaterally. GI: Soft, nontender, nondistended, BS + x 4. MS: no deformity or atrophy. Skin: warm and dry, no rash. Neuro:  Strength and sensation are intact. Psych: AAOx3.  Normal affect.  Labs    CBC  Recent Labs  03/12/16 1859 03/13/16 0639  WBC 12.2* 12.7*  HGB 15.0 14.1  HCT 43.1 40.7  MCV 86.6 84.7  PLT 257 230   Basic Metabolic Panel  Recent Labs  03/12/16 1403 03/12/16 1859  NA 137  --   K 3.9  --   CL 102  --   CO2 27  --   GLUCOSE 104*  --   BUN 17  --   CREATININE 1.01 1.05  CALCIUM 9.7  --    Liver Function Tests No results for input(s):  AST, ALT, ALKPHOS, BILITOT, PROT, ALBUMIN in the last 72 hours. No results for input(s): LIPASE, AMYLASE in the last 72 hours. Cardiac Enzymes  Recent Labs  03/12/16 1403 03/12/16 1859 03/13/16 0047  TROPONINI 0.06* 3.78* 25.80*   BNP Invalid input(s): POCBNP D-Dimer No results for input(s): DDIMER in the last 72 hours. Hemoglobin A1C  Recent Labs  03/12/16 1403  HGBA1C 5.7*   Fasting Lipid Panel  Recent Labs  03/12/16 1403  CHOL 255*  HDL 39*  LDLCALC 152*  TRIG 320*  CHOLHDL 6.5   Thyroid Function Tests No results for input(s): TSH, T4TOTAL, T3FREE, THYROIDAB in the last 72 hours.  Invalid input(s): FREET3  Telemetry    NSR, 70's bpm - Personally Reviewed  ECG    n/a - Personally Reviewed  Radiology    Dg Chest 2 View  Result Date: 03/12/2016 CLINICAL DATA:  Intermittent left-sided chest pain since Friday night. Former smoker. EXAM: CHEST  2 VIEW COMPARISON:   CT scan of the chest dated July 06, 2013, report of a chest x-ray of April 14, 2015. FINDINGS: The lungs are adequately inflated and clear. The heart and pulmonary vascularity are normal. No pulmonary parenchymal nodules or masses are observed. The mediastinum is normal in width. There is no pleural effusion. The bony thorax is unremarkable. IMPRESSION: There is no active cardiopulmonary disease. If the patient's symptoms persist and remain unexplained, chest CT scanning would be a useful next imaging step. Electronically Signed   By: David  SwazilandJordan M.D.   On: 03/12/2016 14:36    Cardiac Studies   LHC 03/12/2016: Conclusion     The left ventricular systolic function is normal.  LV end diastolic pressure is normal.  The left ventricular ejection fraction is 50-55% by visual estimate.  Mid Cx lesion, 20 %stenosed.  Dist LAD lesion, 20 %stenosed.  Mid LAD lesion, 20 %stenosed.  Ost 1st Diag to 1st Diag lesion, 100 %stenosed.   1. Severe one-vessel coronary artery disease with an occluded first diagonal which is likely the culprit for myocardial infarction. 2. Normal LV systolic function with mild anterolateral hypokinesis. Normal left ventricular end-diastolic pressure.  Recommendations: The first diagonal is about 2 mm in diameter with disease extending all the way back to the ostium. Faint collaterals already starting to develop to the territory and thus given all of the above, I felt that the risks of PCI outweigh the benefits. I recommend medical therapy. I'm starting the patient on clopidogrel to be used for one year.  I suspect that his troponin will rise.  The patient should avoid intense exercise for 2 weeks. Cardiac rehabilitation is recommended.     Patient Profile     [age] year old male with history of HLD and chronic back pain who [facility] with stuttering chest pain and was found to have a NSTEMI, with cardiac cath showing an occluded D1 (2 mm in  diameter) medically managed given size of vessel and faint collaterals forming.   Assessment & Plan    1. CAD s/p NSTEMI 11/20: -Medically managed as above -Continue DAPT with ASA 81 mg and Plavix 75 mg daily -No further chest pain -Ambulate this morning -Can check echo in the office if indicated -Lopressor 25 mg bid -Lipitor 40 mg -Cardiac rehab as an outpatient -Post-cath care discussed in detail -Add SL NTG prn  2. HLD: -Start Lipitor as above -Recheck lipid and liver function in 4 weeks  3. Leukocytosis: -No signs of infection -Likely inflammatory  in the setting of #1   Signed, Carola Frost [facility] Pager: [phone] 03/13/2016, 7:37 AM

## 2016-03-13 NOTE — Telephone Encounter (Signed)
Patient [facility] on 03/13/[facility].  Patient understands to follow up with provider Arida on 11/28 at 3[facility]45pm [facility], . Patient understands discharge instructions? yes Patient understands medications and regiment? yes Patient understands to bring all medications to this visit? yes  Provided directions to our office.

## 2016-03-13 NOTE — Discharge Instructions (Addendum)
avoid intense exercise or lifting heavy weights for 2 weeks  Cardiac diet

## 2016-03-13 NOTE — Telephone Encounter (Signed)
Attempted TCM call. No answer. Will call again. Pt as 11/28, 3:45pm appt w/Dr. Kirke CorinArida

## 2016-03-20 ENCOUNTER — Ambulatory Visit (INDEPENDENT_AMBULATORY_CARE_PROVIDER_SITE_OTHER): Payer: 59 | Admitting: Cardiovascular Disease

## 2016-03-20 ENCOUNTER — Encounter: Payer: Self-pay | Admitting: Cardiovascular Disease

## 2016-03-20 VITALS — BP 118/70 | HR 69 | Ht 64.0 in | Wt 159.2 lb

## 2016-03-20 DIAGNOSIS — I2510 Atherosclerotic heart disease of native coronary artery without angina pectoris: Secondary | ICD-10-CM

## 2016-03-20 DIAGNOSIS — I214 Non-ST elevation (NSTEMI) myocardial infarction: Secondary | ICD-10-CM

## 2016-03-20 MED ORDER — ROSUVASTATIN CALCIUM 10 MG PO TABS: 10.0000 mg | ORAL_TABLET | Freq: Every day | ORAL | 5 refills | Status: DC

## 2016-03-20 NOTE — Discharge Summary (Signed)
[facility] [place] [facility]   PATIENT NAME: Matthew Arias    MR#:  [id number]  DATE OF BIRTH:  March 18, 1956  DATE OF ADMISSION:  03/12/2016 ADMITTING PHYSICIAN: Enid Baasadhika Kalisetti, MD  DATE OF DISCHARGE: 03/13/2016 11:17 AM  PRIMARY CARE PHYSICIAN: Curtis SitesBERT J KLEIN III, MD   ADMISSION DIAGNOSIS:  Unstable angina (HCC) [I20.0] Elevated troponin [R74.8]  DISCHARGE DIAGNOSIS:  Principal Problem:   NSTEMI (non-ST elevated myocardial infarction) (HCC) Active Problems:   Accelerated hypertension   Elevated troponin   Unstable angina (HCC)   SECONDARY DIAGNOSIS:   Past Medical History:  Diagnosis Date  . Degenerative disc disease, lumbar   . Hyperlipidemia   . Pulmonary nodules      ADMITTING HISTORY  Matthew Bayony Mccarrell  is a 60 y.o. male with a known history of Lumbar degenerative disc disease, hyperlipidemia not on any medications at home presents to hospital secondary to chest pain. Patient is very active at baseline, he said he was driving to the beach 2 days ago and suddenly had significant left-sided chest pain radiating down to the left shoulder, 10/10 and lasted for more than 30 minutes. He said he had to turn back and drive back home that night. It eased off after a little while. He called his family and they thought it was maybe secondary to gas and since symptoms subsided he stayed home. It was not associated with any nausea or vomiting. Patient doesn't have any known cardiac history. No chest pain with recent exercise activities as well. This afternoon he was playing with his grandson and suddenly had similar chest pain that made him double over and presented to the emergency room. EKG showing lateral ST depressions when compared to his old EKG from 8 years ago. Troponin is borderline elevated at 0.06. Patient still has 4/10 chest pain at this time. He is being admitted for possible NSTEMI  HOSPITAL COURSE:   * NSTEMI Significant troponin elevation Cardiac cath  on 11/20 showed severe one-vessel CAD with an occluded D1 felt to be the culprit lesion. The disease extended all the way back to the ostium with faint collaterals already starting to form. Vessel diameter was approximately 2 mm. Given all of the above risks of PCU outweighed the benefits and medical management was advised. Treated with ASA, Plavix, Statin, BB Heparin initiated on admission was later stopped On day of discharge ambulated in hallway without issues  Discharge home. F/U with PCP and cardiology  CONSULTS OBTAINED:  Treatment Team:  Iran OuchMuhammad A Arida, MD  DRUG ALLERGIES:   Allergies  Allergen Reactions  . Codeine Nausea Only    Pt states he doesn't do well with pain medications. They make him nauseas. Said he didn't know the name of them because he doesn't really take anything. I found this in his history.     DISCHARGE MEDICATIONS:   Discharge Medication List as of 03/13/2016 10:57 AM    START taking these medications   Details  aspirin EC 81 MG EC tablet Take 1 tablet (81 mg total) by mouth daily., Starting Tue 03/13/2016, OTC    atorvastatin (LIPITOR) 40 MG tablet Take 1 tablet (40 mg total) by mouth daily at 6 PM., Starting Tue 03/13/2016, Normal    clopidogrel (PLAVIX) 75 MG tablet Take 1 tablet (75 mg total) by mouth daily with breakfast., Starting Tue 03/13/2016, Normal    metoprolol tartrate (LOPRESSOR) 25 MG tablet Take 1 tablet (25 mg total) by mouth 2 (two) times daily., Starting Tue  03/13/2016, Normal    nitroGLYCERIN (NITROSTAT) 0.4 MG SL tablet Place 1 tablet (0.4 mg total) under the tongue every 5 (five) minutes as needed for chest pain., Starting Tue 03/13/2016, Normal        Today   VITAL SIGNS:  Blood pressure 119/62, pulse 76, temperature 97.8 F (36.6 C), temperature source Oral, resp. rate 17, height 5\' 4"  (1.626 m), weight 70.3 kg (155 lb), SpO2 97 %.  I/O:  No intake or output data in the 24 hours ending 03/20/16 0301  PHYSICAL  EXAMINATION:  Physical Exam  GENERAL:  60 y.o.-year-old patient lying in the bed with no acute distress.  LUNGS: Normal breath sounds bilaterally, no wheezing, rales,rhonchi or crepitation. No use of accessory muscles of respiration.  CARDIOVASCULAR: S1, S2 normal. No murmurs, rubs, or gallops.  ABDOMEN: Soft, non-tender, non-distended. Bowel sounds present. No organomegaly or mass.  NEUROLOGIC: Moves all 4 extremities. PSYCHIATRIC: The patient is alert and oriented x 3.  SKIN: No obvious rash, lesion, or ulcer.   DATA REVIEW:   CBC  Recent Labs Lab 03/13/16 0639  WBC 12.7*  HGB 14.1  HCT 40.7  PLT 230    Chemistries   Recent Labs Lab 03/13/16 0639  NA 136  K 3.9  CL 106  CO2 24  GLUCOSE 136*  BUN 12  CREATININE 1.01  CALCIUM 8.6*    Cardiac Enzymes  Recent Labs Lab 03/13/16 0639  TROPONINI 20.76*    Microbiology Results  Results for orders placed or performed in visit on 06/13/12  Culture, blood (single)     Status: None   Collection Time: 06/13/12  4:40 PM  Result Value Ref Range Status   Micro Text Report   Final       COMMENT                   NO GROWTH AEROBICALLY/ANAEROBICALLY IN 5 DAYS   ANTIBIOTIC                                                      Culture, blood (single)     Status: None   Collection Time: 06/13/12  4:50 PM  Result Value Ref Range Status   Micro Text Report   Final       COMMENT                   NO GROWTH AEROBICALLY/ANAEROBICALLY IN 5 DAYS   ANTIBIOTIC                                                        RADIOLOGY:  No results found.  Follow up with PCP in 1 week.  Management plans discussed with the patient, family and they are in agreement.  CODE STATUS:  Code Status History    Date Active Date Inactive Code Status Order ID Comments User Context   03/12/2016  6:40 PM 03/13/2016  2:17 PM Full Code [id number]  Iran Ouch, MD Inpatient   03/12/2016  6:40 PM 03/12/2016  6:40 PM Full Code [id number]   Enid Baas, MD Inpatient      TOTAL TIME TAKING CARE OF THIS PATIENT  ON DAY OF DISCHARGE: more than 30 minutes.   Milagros LollSudini, Rhythm Gubbels R M.D on 03/20/2016 at 3:01 AM  Between 7am to 6pm - Pager - [phone]  After 6pm go to [email or web address] - password [email or web address] [facility]  [facility]  Office  [phone]  CC: Primary care physician; Lynnea FerrierBERT J KLEIN III, MD  Note: This dictation was prepared with Dragon dictation along with smaller phrase technology. Any transcriptional errors that result from this process are unintentional.

## 2016-03-20 NOTE — Progress Notes (Signed)
Cardiology Office Note   Date:  03/20/2016   ID:  Matthew Reiningony L Hodkinson, DOB 01-01-1956, MRN [id number]  PCP:  Lynnea FerrierBERT J KLEIN III, MD  Cardiologist:   Lorine BearsMuhammad Manolito Jurewicz, MD   Chief Complaint  Patient presents with  . other    [facility]; chest pain. Meds reviewed by the pt. verbally.  "doing well."       History of Present Illness: Matthew Arias is a 60 y.o. male who presents for A follow-up visit after recent hospitalization for non-ST elevation myocardial infarction. He has overall been healthy throughout his life and very active. He presented recently with chest pain and was found to have elevated troponin up to 25. EKG showed anterolateral ST depression. I proceeded with emergent cardiac catheterization which showed an occluded first diagonal with mild disease elsewhere. There was faint collaterals and the vessel was overall 2 mm in diameter with ostial location. I elected to treat him medically. Ejection fraction was 50-55%. He had no recurrent chest pain since his hospital discharge and overall he has been doing very well. He is compliant with his medications. He reports feeling sick with low-grade fever and fatigue every time he takes atorvastatin.   Past Medical History:  Diagnosis Date  . Degenerative disc disease, lumbar   . Hyperlipidemia   . Pulmonary nodules     Past Surgical History:  Procedure Laterality Date  . CARDIAC CATHETERIZATION N/A 03/12/2016   Procedure: Left Heart Cath and Coronary Angiography;  Surgeon: Iran OuchMuhammad A Lavarius Doughten, MD;  Location: [facility] INVASIVE CV LAB;  Service: Cardiovascular;  Laterality: N/A;  . CARPAL TUNNEL RELEASE    . COLONOSCOPY    . LUMBAR LAMINECTOMY    . TONSILLECTOMY       Current Outpatient Prescriptions  Medication Sig Dispense Refill  . aspirin EC 81 MG EC tablet Take 1 tablet (81 mg total) by mouth daily.    . clopidogrel (PLAVIX) 75 MG tablet Take 1 tablet (75 mg total) by mouth daily with breakfast. 30 tablet 0  . metoprolol  tartrate (LOPRESSOR) 25 MG tablet Take 1 tablet (25 mg total) by mouth 2 (two) times daily. 60 tablet 0  . nitroGLYCERIN (NITROSTAT) 0.4 MG SL tablet Place 1 tablet (0.4 mg total) under the tongue every 5 (five) minutes as needed for chest pain. 20 tablet 0  . rosuvastatin (CRESTOR) 10 MG tablet Take 1 tablet (10 mg total) by mouth daily. 30 tablet 5   No current facility-administered medications for this visit.     Allergies:   Codeine    Social History:  The patient  reports that he quit smoking about 10 months ago. His smoking use included Cigarettes. He has never used smokeless tobacco. He reports that he drinks alcohol. He reports that he does not use drugs.   Family History:  The patient's family history includes Arthritis in his mother; CAD in his father.    ROS:  Please see the history of present illness.   Otherwise, review of systems are positive for none.   All other systems are reviewed and negative.    PHYSICAL EXAM: VS:  BP 118/70 (BP Location: Left Arm, Patient Position: Sitting, Cuff Size: Normal)   Pulse 69   Ht 5\' 4"  (1.626 m)   Wt 159 lb 4 oz (72.2 kg)   BMI 27.34 kg/m  , BMI Body mass index is 27.34 kg/m. GEN: Well nourished, well developed, in no acute distress  HEENT: normal  Neck: no  JVD, carotid bruits, or masses Cardiac: RRR; no murmurs, rubs, or gallops,no edema  Respiratory:  clear to auscultation bilaterally, normal work of breathing GI: soft, nontender, nondistended, + BS MS: no deformity or atrophy  Skin: warm and dry, no rash Neuro:  Strength and sensation are intact Psych: euthymic mood, full affect Right radial pulse is normal with no hematoma.  EKG:  EKG is ordered today. The ekg ordered today demonstrates normal sinus rhythm with no significant ST or T wave changes.   Recent Labs: 03/13/2016: BUN 12; Creatinine, Ser 1.01; Hemoglobin 14.1; Platelets 230; Potassium 3.9; Sodium 136    Lipid Panel    Component Value Date/Time   CHOL 255  (H) 03/12/2016 1403   TRIG 320 (H) 03/12/2016 1403   HDL 39 (L) 03/12/2016 1403   CHOLHDL 6.5 03/12/2016 1403   VLDL 64 (H) 03/12/2016 1403   LDLCALC 152 (H) 03/12/2016 1403      Wt Readings from Last 3 Encounters:  03/20/16 159 lb 4 oz (72.2 kg)  03/12/16 155 lb (70.3 kg)      No flowsheet data found.    ASSESSMENT AND PLAN:  1.  Recent non-ST elevation myocardial infarction: The culprit was an occluded first diagonal. He was treated medically and overall he is doing very well with no anginal symptoms. I am planning to treat him with dual antiplatelet therapy for one year. Continue low-dose metoprolol. Ejection fraction was low normal at 50-55%. I referred him to cardiac rehabilitation.  2. Hyperlipidemia: His lipid profile showed an LDL of 152. He seems to be having some side effects with atorvastatin. Thus, I switched him to rosuvastatin 10 mg once daily. Check fasting lipid and liver profile in one month.    Disposition:   FU with me in 3 months  Signed,  Lorine BearsMuhammad Jery Hollern, MD  03/20/2016 5:12 PM    [facility]

## 2016-03-20 NOTE — Patient Instructions (Signed)
Medication Instructions:  Your physician has recommended you make the following change in your medication:  STOP taking atorvastatin START taking rosuvastatin 10mg  once daily   Labwork: Fasting lipid and liver profile in one month. Nothing to eat or drink after midnight the evening before your labs  Testing/Procedures: none  Follow-Up: Your physician recommends that you schedule a follow-up appointment in: 3 months with Dr. Kirke CorinArida   Any Other Special Instructions Will Be Listed Below (If Applicable). You have been referred to Cardiac Rehab. You will receive a call to set up appointments.      If you need a refill on your cardiac medications before your next appointment, please call your pharmacy.

## 2016-03-21 ENCOUNTER — Other Ambulatory Visit: Payer: Self-pay | Admitting: Internal Medicine

## 2016-03-21 DIAGNOSIS — R918 Other nonspecific abnormal finding of lung field: Secondary | ICD-10-CM

## 2016-03-28 ENCOUNTER — Ambulatory Visit
Admission: RE | Admit: 2016-03-28 | Discharge: 2016-03-28 | Disposition: A | Discharge status: Home / Self Care | Payer: 59 | Source: Ambulatory Visit | Attending: Internal Medicine | Admitting: Internal Medicine

## 2016-03-28 DIAGNOSIS — J479 Bronchiectasis, uncomplicated: Secondary | ICD-10-CM | POA: Diagnosis not present

## 2016-03-28 DIAGNOSIS — R918 Other nonspecific abnormal finding of lung field: Secondary | ICD-10-CM

## 2016-03-28 DIAGNOSIS — I2510 Atherosclerotic heart disease of native coronary artery without angina pectoris: Secondary | ICD-10-CM | POA: Insufficient documentation

## 2016-03-28 DIAGNOSIS — J8410 Pulmonary fibrosis, unspecified: Secondary | ICD-10-CM | POA: Diagnosis not present

## 2016-04-02 ENCOUNTER — Encounter: Payer: 59 | Attending: Cardiovascular Disease | Admitting: *Deleted

## 2016-04-02 VITALS — Ht 64.5 in | Wt 160.0 lb

## 2016-04-02 DIAGNOSIS — I214 Non-ST elevation (NSTEMI) myocardial infarction: Secondary | ICD-10-CM | POA: Diagnosis not present

## 2016-04-02 NOTE — Progress Notes (Signed)
Cardiac Individual Treatment Plan  Patient Details  Name: Matthew Arias MRN: [id number] Date of Birth: [birth year] Referring Provider:   Flowsheet Row Cardiac Rehab from 04/02/2016 in [facility] Cardiac and Pulmonary Rehab  Referring Provider  [name] MD      Initial Encounter Date:  Flowsheet Row Cardiac Rehab from 04/02/2016 in [facility] Cardiac and Pulmonary Rehab  Date  04/02/16  Referring Provider  [name] MD      Visit Diagnosis: NSTEMI (non-ST elevated myocardial infarction) [facility])  Patient's Home Medications on Admission:  Current Outpatient Prescriptions:  .  aspirin EC 81 MG EC tablet, Take 1 tablet (81 mg total) by mouth daily., Disp: , Rfl:  .  clopidogrel (PLAVIX) 75 MG tablet, Take 1 tablet (75 mg total) by mouth daily with breakfast., Disp: 30 tablet, Rfl: 0 .  metoprolol tartrate (LOPRESSOR) 25 MG tablet, Take 1 tablet (25 mg total) by mouth 2 (two) times daily., Disp: 60 tablet, Rfl: 0 .  nitroGLYCERIN (NITROSTAT) 0.4 MG SL tablet, Place 1 tablet (0.4 mg total) under the tongue every 5 (five) minutes as needed for chest pain., Disp: 20 tablet, Rfl: 0 .  rosuvastatin (CRESTOR) 10 MG tablet, Take 1 tablet (10 mg total) by mouth daily., Disp: 30 tablet, Rfl: 5  Past Medical History: Past Medical History:  Diagnosis Date  . Coronary artery disease    Non-ST elevation myocardial infarction in November 2017. Cardiac catheterization showed occluded first diagonal with faint collaterals. EF was 50-55%. There was mild nonobstructive disease otherwise. He was treated medically.  . Degenerative disc disease, lumbar   . Hyperlipidemia   . Pulmonary nodules     Tobacco Use: History  Smoking Status  . Former Smoker  . Types: Cigarettes  . Quit date: 04/24/2015  Smokeless Tobacco  . Never Used    Comment: used to smoke 1PPD for 40 years    Labs: Recent Review [employer] for ITP Cardiac and Pulmonary Rehab Latest Ref Rng & Units 03/12/2016   Cholestrol  0 - 200 mg/dL 409(W)   LDLCALC 0 - 99 mg/dL 119(J)   HDL >47 mg/dL 82(N)   Trlycerides <562 mg/dL 130(Q)   Hemoglobin M5H 4.8 - 5.6 % 5.7(H)       Exercise Target Goals: Date: 04/02/16  Exercise Program Goal: Individual exercise prescription set with THRR, safety & activity barriers. Participant demonstrates ability to understand and report RPE using BORG scale, to self-measure pulse accurately, and to acknowledge the importance of the exercise prescription.  Exercise Prescription Goal: Starting with aerobic activity 30 plus minutes a day, 3 days per week for initial exercise prescription. Provide home exercise prescription and guidelines that participant acknowledges understanding prior to discharge.  Activity Barriers & Risk Stratification:     Activity Barriers & Cardiac Risk Stratification - 04/02/16 1256      Activity Barriers & Cardiac Risk Stratification   Activity Barriers Back Problems;Other (comment)  History of 2 surgeries on lower back.  LImits self to prevent discomfort.   Comments cortisone shot in left shoulder for pain at Thanksgiving   Cardiac Risk Stratification High      6 Minute Walk:     6 Minute Walk    Row Name 04/02/16 1418         6 Minute Walk   Phase Initial     Distance 1664 feet     Walk Time 6 minutes     # of Rest Breaks 0  MPH 3.15     METS 4.16     RPE 9     VO2 Peak 14.57     Symptoms No     Resting HR 58 bpm     Resting BP 144/74     Max Ex. HR 106 bpm     Max Ex. BP 144/68     2 Minute Post BP 142/60        Initial Exercise Prescription:     Initial Exercise Prescription - 04/02/16 1400      Date of Initial Exercise RX and Referring Provider   Date 04/02/16   Referring Provider [name] MD     Treadmill   MPH 3   Grade 2   Minutes 15   METs 4.12     NuStep   Level 4   Watts --  80-100 spm   Minutes 15   METs 2.5     REL-XR   Level 3   Watts --  speed >50 rpm   Minutes 15   METs 2      Prescription Details   Frequency (times per week) 3   Duration Progress to 45 minutes of aerobic exercise without signs/symptoms of physical distress     Intensity   THRR 40-80% of Max Heartrate 98-139   Ratings of Perceived Exertion 11-15   Perceived Dyspnea 0-4     Progression   Progression Continue to progress workloads to maintain intensity without signs/symptoms of physical distress.     Resistance Training   Training Prescription Yes   Weight 4 lbs   Reps 10-12      Perform Capillary Blood Glucose checks as needed.  Exercise Prescription Changes:     Exercise Prescription Changes    Row Name 04/02/16 1400             Exercise Review   Progression -  walk test results         Response to Exercise   Blood Pressure (Admit) 144/74       Blood Pressure (Exercise) 144/68       Blood Pressure (Exit) 142/60       Heart Rate (Admit) 58 bpm       Heart Rate (Exercise) 106 bpm       Heart Rate (Exit) 60 bpm       Oxygen Saturation (Admit) 98 %       Oxygen Saturation (Exercise) 98 %       Rating of Perceived Exertion (Exercise) 9       Symptoms none          Exercise Comments:     Exercise Comments    Row Name 04/02/16 1421           Exercise Comments [name]'s main goal is to know that his heart and rhythm are good to be independent.          Discharge Exercise Prescription (Final Exercise Prescription Changes):     Exercise Prescription Changes - 04/02/16 1400      Exercise Review   Progression --  walk test results     Response to Exercise   Blood Pressure (Admit) 144/74   Blood Pressure (Exercise) 144/68   Blood Pressure (Exit) 142/60   Heart Rate (Admit) 58 bpm   Heart Rate (Exercise) 106 bpm   Heart Rate (Exit) 60 bpm   Oxygen Saturation (Admit) 98 %   Oxygen Saturation (Exercise) 98 %   Rating of Perceived Exertion (Exercise)  9   Symptoms none      Nutrition:  Target Goals: Understanding of nutrition guidelines, daily intake of  sodium 1500mg , cholesterol 200mg , calories 30% from fat and 7% or less from saturated fats, daily to have 5 or more servings of fruits and vegetables.  Biometrics:     Pre Biometrics - 04/02/16 1423      Pre Biometrics   Height 5' 4.5" (1.638 m)   Weight 160 lb (72.6 kg)   Waist Circumference 35 inches   Hip Circumference 37 inches   Waist to Hip Ratio 0.95 %   BMI (Calculated) 27.1   Single Leg Stand 30 seconds       Nutrition Therapy Plan and Nutrition Goals:     Nutrition Therapy & Goals - 04/02/16 1252      Intervention Plan   Intervention Prescribe, educate and counsel regarding individualized specific dietary modifications aiming towards targeted core components such as weight, hypertension, lipid management, diabetes, heart failure and other comorbidities.   Expected Outcomes Short Term Goal: Understand basic principles of dietary content, such as calories, fat, sodium, cholesterol and nutrients.;Short Term Goal: A plan has been developed with personal nutrition goals set during dietitian appointment.;Long Term Goal: Adherence to prescribed nutrition plan.      Nutrition Discharge: Rate Your Plate Scores:     Nutrition Assessments - 04/02/16 1253      Rate Your Plate Scores   Pre Score 66   Pre Score % 73.3 %      Nutrition Goals Re-Evaluation:   Psychosocial: Target Goals: Acknowledge presence or absence of depression, maximize coping skills, provide positive support system. Participant is able to verbalize types and ability to use techniques and skills needed for reducing stress and depression.  Initial Review & Psychosocial Screening:     Initial Psych Review & Screening - 04/02/16 1254      Initial Review   Current issues with --  None reported     Family Dynamics   Good Support System? Yes  Wife and sons at home     Barriers   Psychosocial barriers to participate in program There are no identifiable barriers or psychosocial needs.;The patient  should benefit from training in stress management and relaxation.     Screening Interventions   Interventions Encouraged to exercise      Quality of Life Scores:     Quality of Life - 04/02/16 1254      Quality of Life Scores   Health/Function Pre 30 %   Socioeconomic Pre 30 %   Psych/Spiritual Pre 30 %   Family Pre 30 %   GLOBAL Pre 30 %      PHQ-9: Recent Review Flowsheet Data    Depression screen [facility] 2/9 04/02/2016   Decreased Interest 0   Down, Depressed, Hopeless 0   PHQ - 2 Score 0   Altered sleeping 0   Tired, decreased energy 0   Change in appetite 0   Feeling bad or failure about yourself  0   Trouble concentrating 0   Moving slowly or fidgety/restless 0   Suicidal thoughts 0   PHQ-9 Score 0      Psychosocial Evaluation and Intervention:   Psychosocial Re-Evaluation:   Vocational Rehabilitation: Provide vocational rehab assistance to qualifying candidates.   Vocational Rehab Evaluation & Intervention:     Vocational Rehab - 04/02/16 1256      Initial Vocational Rehab Evaluation & Intervention   Assessment shows need for Vocational Rehabilitation No  Education: Education Goals: Education classes will be provided on a weekly basis, covering required topics. Participant will state understanding/return demonstration of topics presented.  Learning Barriers/Preferences:     Learning Barriers/Preferences - 04/02/16 1256      Learning Barriers/Preferences   Learning Barriers None   Learning Preferences None      Education Topics: General Nutrition Guidelines/Fats and Fiber: -Group instruction provided by verbal, written material, models and posters to present the general guidelines for heart healthy nutrition. Gives an explanation and review of dietary fats and fiber.   Controlling Sodium/Reading Food Labels: -Group verbal and written material supporting the discussion of sodium use in heart healthy nutrition. Review and explanation  with models, verbal and written materials for utilization of the food label.   Exercise Physiology & Risk Factors: - Group verbal and written instruction with models to review the exercise physiology of the cardiovascular system and associated critical values. Details cardiovascular disease risk factors and the goals associated with each risk factor.   Aerobic Exercise & Resistance Training: - Gives group verbal and written discussion on the health impact of inactivity. On the components of aerobic and resistive training programs and the benefits of this training and how to safely progress through these programs.   Flexibility, Balance, General Exercise Guidelines: - Provides group verbal and written instruction on the benefits of flexibility and balance training programs. Provides general exercise guidelines with specific guidelines to those with heart or lung disease. Demonstration and skill practice provided.   Stress Management: - Provides group verbal and written instruction about the health risks of elevated stress, cause of high stress, and healthy ways to reduce stress.   Depression: - Provides group verbal and written instruction on the correlation between heart/lung disease and depressed mood, treatment options, and the stigmas associated with seeking treatment.   Anatomy & Physiology of the Heart: - Group verbal and written instruction and models provide basic cardiac anatomy and physiology, with the coronary electrical and arterial systems. Review of: AMI, Angina, Valve disease, Heart Failure, Cardiac Arrhythmia, Pacemakers, and the ICD.   Cardiac Procedures: - Group verbal and written instruction and models to describe the testing methods done to diagnose heart disease. Reviews the outcomes of the test results. Describes the treatment choices: Medical Management, Angioplasty, or Coronary Bypass Surgery.   Cardiac Medications: - Group verbal and written instruction to  review commonly prescribed medications for heart disease. Reviews the medication, class of the drug, and side effects. Includes the steps to properly store meds and maintain the prescription regimen.   Go Sex-Intimacy & Heart Disease, Get SMART - Goal Setting: - Group verbal and written instruction through game format to discuss heart disease and the return to sexual intimacy. Provides group verbal and written material to discuss and apply goal setting through the application of the S.M.A.R.T. Method.   Other Matters of the Heart: - Provides group verbal, written materials and models to describe Heart Failure, Angina, Valve Disease, and Diabetes in the realm of heart disease. Includes description of the disease process and treatment options available to the cardiac patient.   Exercise & Equipment Safety: - Individual verbal instruction and demonstration of equipment use and safety with use of the equipment. Flowsheet Row [facility] Cardiac [facility]  Date  04/02/16  Educator  SB  Instruction Review Code  2- meets goals/outcomes      Infection Prevention: - Provides verbal and written material to individual with discussion of infection control  including proper hand washing and proper equipment cleaning during exercise session. Flowsheet Row Cardiac Rehab from 04/02/2016 in [facility] Cardiac and Pulmonary Rehab  Date  04/02/16  Educator  SB  Instruction Review Code  2- meets goals/outcomes      Falls Prevention: - Provides verbal and written material to individual with discussion of falls prevention and safety. Flowsheet Row Cardiac Rehab from 04/02/2016 in [facility] Cardiac and Pulmonary Rehab  Date  04/02/16  Educator  Sb  Instruction Review Code  2- meets goals/outcomes      Diabetes: - Individual verbal and written instruction to review signs/symptoms of diabetes, desired ranges of glucose level fasting, after meals and with exercise. Advice that  pre and post exercise glucose checks will be done for 3 sessions at entry of program.    Knowledge Questionnaire Score:     Knowledge Questionnaire Score - 04/02/16 1256      Knowledge Questionnaire Score   Pre Score 14/28      Core Components/Risk Factors/Patient Goals at Admission:     Personal Goals and Risk Factors at Admission - 04/02/16 1253      Core Components/Risk Factors/Patient Goals on Admission    Weight Management Yes;Weight Loss   Intervention Weight Management: Develop a combined nutrition and exercise program designed to reach desired caloric intake, while maintaining appropriate intake of nutrient and fiber, sodium and fats, and appropriate energy expenditure required for the weight goal.;Weight Management: Provide education and appropriate resources to help participant work on and attain dietary goals.   Admit Weight 160 lb (72.6 kg)   Goal Weight: Short Term 155 lb (70.3 kg)   Goal Weight: Long Term 150 lb (68 kg)   Expected Outcomes Short Term: Continue to assess and modify interventions until short term weight is achieved;Long Term: Adherence to nutrition and physical activity/exercise program aimed toward attainment of established weight goal;Weight Loss: Understanding of general recommendations for a balanced deficit meal plan, which promotes 1-2 lb weight loss per week and includes a negative energy balance of [phone] kcal/d;Understanding recommendations for meals to include 15-35% energy as protein, 25-35% energy from fat, 35-60% energy from carbohydrates, less than 200mg  of dietary cholesterol, 20-35 gm of total fiber daily;Understanding of distribution of calorie intake throughout the day with the consumption of 4-5 meals/snacks   Increase Strength and Stamina Yes   Intervention Provide advice, education, support and counseling about physical activity/exercise needs.;Develop an individualized exercise prescription for aerobic and resistive training based on  initial evaluation findings, risk stratification, comorbidities and participant's personal goals.   Expected Outcomes Achievement of increased cardiorespiratory fitness and enhanced flexibility, muscular endurance and strength shown through measurements of functional capacity and personal statement of participant.   Hypertension Yes   Intervention Provide education on lifestyle modifcations including regular physical activity/exercise, weight management, moderate sodium restriction and increased consumption of fresh fruit, vegetables, and low fat dairy, alcohol moderation, and smoking cessation.;Monitor prescription use compliance.   Expected Outcomes Short Term: Continued assessment and intervention until BP is < 140/3mm HG in hypertensive participants. < 130/75mm HG in hypertensive participants with diabetes, heart failure or chronic kidney disease.;Long Term: Maintenance of blood pressure at goal levels.   Lipids Yes   Intervention Provide education and support for participant on nutrition & aerobic/resistive exercise along with prescribed medications to achieve LDL 70mg , HDL >40mg .   Expected Outcomes Short Term: Participant states understanding of desired cholesterol values and is compliant with medications prescribed. Participant is following exercise prescription and nutrition guidelines.;Long Term: Cholesterol  controlled with medications as prescribed, with individualized exercise RX and with personalized nutrition plan. Value goals: LDL < 70mg , HDL > 40 mg.      Core Components/Risk Factors/Patient Goals Review:    Core Components/Risk Factors/Patient Goals at Discharge (Final Review):    ITP Comments:     ITP Comments    Row Name 04/02/16 1251           ITP Comments Med review completed. Initial ITP created   Documentation of diagnosis found  [facility] 03/12/2016          Comments: Initial ITP

## 2016-04-02 NOTE — Patient Instructions (Signed)
Patient Instructions  Patient Details  Name: Matthew Arias MRN: [id number] Date of Birth: 01/16/1956 Referring Provider:  Iran OuchArida, Muhammad A, MD  Below are the personal goals you chose as well as exercise and nutrition goals. Our goal is to help you keep on track towards obtaining and maintaining your goals. We will be discussing your progress on these goals with you throughout the program.  Initial Exercise Prescription:     Initial Exercise Prescription - 04/02/16 1400      Date of Initial Exercise RX and Referring Provider   Date 04/02/16   Referring Provider Lorine BearsArida, Muhammad MD     Treadmill   MPH 3   Grade 2   Minutes 15   METs 4.12     NuStep   Level 4   Watts --  80-100 spm   Minutes 15   METs 2.5     REL-XR   Level 3   Watts --  speed >50 rpm   Minutes 15   METs 2     Prescription Details   Frequency (times per week) 3   Duration Progress to 45 minutes of aerobic exercise without signs/symptoms of physical distress     Intensity   THRR 40-80% of Max Heartrate 98-139   Ratings of Perceived Exertion 11-15   Perceived Dyspnea 0-4     Progression   Progression Continue to progress workloads to maintain intensity without signs/symptoms of physical distress.     Resistance Training   Training Prescription Yes   Weight 4 lbs   Reps 10-12      Exercise Goals: Frequency: Be able to perform aerobic exercise three times per week working toward 3-5 days per week.  Intensity: Work with a perceived exertion of 11 (fairly light) - 15 (hard) as tolerated. Follow your new exercise prescription and watch for changes in prescription as you progress with the program. Changes will be reviewed with you when they are made.  Duration: You should be able to do 30 minutes of continuous aerobic exercise in addition to a 5 minute warm-up and a 5 minute cool-down routine.  Nutrition Goals: Your personal nutrition goals will be established when you do your nutrition analysis  with the dietician.  The following are nutrition guidelines to follow: Cholesterol < 200mg /day Sodium < 1500mg /day Fiber: Men over 50 yrs - 30 grams per day  Personal Goals:     Personal Goals and Risk Factors at Admission - 04/02/16 1253      Core Components/Risk Factors/Patient Goals on Admission    Weight Management Yes;Weight Loss   Intervention Weight Management: Develop a combined nutrition and exercise program designed to reach desired caloric intake, while maintaining appropriate intake of nutrient and fiber, sodium and fats, and appropriate energy expenditure required for the weight goal.;Weight Management: Provide education and appropriate resources to help participant work on and attain dietary goals.   Admit Weight 160 lb (72.6 kg)   Goal Weight: Short Term 155 lb (70.3 kg)   Goal Weight: Long Term 150 lb (68 kg)   Expected Outcomes Short Term: Continue to assess and modify interventions until short term weight is achieved;Long Term: Adherence to nutrition and physical activity/exercise program aimed toward attainment of established weight goal;Weight Loss: Understanding of general recommendations for a balanced deficit meal plan, which promotes 1-2 lb weight loss per week and includes a negative energy balance of [phone] kcal/d;Understanding recommendations for meals to include 15-35% energy as protein, 25-35% energy from fat, 35-60% energy from carbohydrates,  less than 200mg  of dietary cholesterol, 20-35 gm of total fiber daily;Understanding of distribution of calorie intake throughout the day with the consumption of 4-5 meals/snacks   Increase Strength and Stamina Yes   Intervention Provide advice, education, support and counseling about physical activity/exercise needs.;Develop an individualized exercise prescription for aerobic and resistive training based on initial evaluation findings, risk stratification, comorbidities and participant's personal goals.   Expected Outcomes  Achievement of increased cardiorespiratory fitness and enhanced flexibility, muscular endurance and strength shown through measurements of functional capacity and personal statement of participant.   Hypertension Yes   Intervention Provide education on lifestyle modifcations including regular physical activity/exercise, weight management, moderate sodium restriction and increased consumption of fresh fruit, vegetables, and low fat dairy, alcohol moderation, and smoking cessation.;Monitor prescription use compliance.   Expected Outcomes Short Term: Continued assessment and intervention until BP is < 140/8290mm HG in hypertensive participants. < 130/6880mm HG in hypertensive participants with diabetes, heart failure or chronic kidney disease.;Long Term: Maintenance of blood pressure at goal levels.   Lipids Yes   Intervention Provide education and support for participant on nutrition & aerobic/resistive exercise along with prescribed medications to achieve LDL 70mg , HDL >40mg .   Expected Outcomes Short Term: Participant states understanding of desired cholesterol values and is compliant with medications prescribed. Participant is following exercise prescription and nutrition guidelines.;Long Term: Cholesterol controlled with medications as prescribed, with individualized exercise RX and with personalized nutrition plan. Value goals: LDL < 70mg , HDL > 40 mg.      Tobacco Use Initial Evaluation: History  Smoking Status  . Former Smoker  . Types: Cigarettes  . Quit date: 04/24/2015  Smokeless Tobacco  . Never Used    Comment: used to smoke 1PPD for 40 years    Copy of goals given to participant.

## 2016-04-03 ENCOUNTER — Telehealth: Payer: Self-pay | Admitting: Cardiovascular Disease

## 2016-04-03 NOTE — Telephone Encounter (Signed)
Left message for Dr. Suzi RootsWhitsett's office to call back as I am unsure if pt needs cardiac clearance or instructions regarding Plavix.

## 2016-04-03 NOTE — Telephone Encounter (Signed)
Request for surgical clearance:  1. What type of surgery is being performed? Broken Tooth needs to be pulled   2. When is this surgery scheduled? Waiting on clearance   3. Are there any medications that need to be held prior to surgery and how long?doesnt know   4. Name of physician performing surgery? Dr.William D Whitset  5. What is your office phone and fax number?  Dr.William D Whitsett  DDS phne [phone] FAX [phone]

## 2016-04-04 ENCOUNTER — Encounter: Payer: 59 | Admitting: *Deleted

## 2016-04-04 DIAGNOSIS — I214 Non-ST elevation (NSTEMI) myocardial infarction: Secondary | ICD-10-CM

## 2016-04-04 NOTE — Telephone Encounter (Signed)
Letter faxed to [phone]. Left message on Dr. Suzi RootsWhitsett's voice mail

## 2016-04-04 NOTE — Telephone Encounter (Addendum)
Dr. Judithann SheenWhitsett returned call stating he does not want to pull pt's tooth however, it needs a crown. He is aware pt takes Plavix and is agreeable to placing crown while continuing the medication. Pt told Dr. Judithann SheenWhitsett he was advised to avoid any dental procedure, including cleanings, for 6 weeks post 11/20 hospital admission for STEMI. Dr. Okey DupreEnd reviewed pt's chart and finds it acceptable to proceed w/crown, avoiding lidocaine w/epi. I reviewed recommendations verbally w/Dr. Judithann SheenWhitsett who confirms he will not give pt lidocaine w/epi. They will proceed w/scheduling appt and letter will be faxed to [phone] today.

## 2016-04-04 NOTE — Progress Notes (Signed)
Daily Session Note  Patient Details  Name: Matthew Arias MRN: [id number] Date of Birth: 04/23/1956 Referring Provider:   Flowsheet Row Cardiac Rehab from 04/02/2016 in [facility] Cardiac and Pulmonary Rehab  Referring Provider  Kathlyn Sacramento MD      Encounter Date: 04/04/2016  Check In:     Session Check In - 04/04/16 0903      Check-In   Location [facility]-Cardiac & Pulmonary Rehab   Staff Present Alberteen Sam, MA, ACSM RCEP, Exercise Physiologist;Amanda Oletta Darter, BA, ACSM CEP, Exercise Physiologist;Other  Simmie Davies, RN, BSN   Supervising physician immediately available to respond to emergencies See telemetry face sheet for immediately available ER MD   Medication changes reported     No   Fall or balance concerns reported    No   Warm-up and Cool-down Performed on first and last piece of equipment   Resistance Training Performed Yes   VAD Patient? No     Pain Assessment   Currently in Pain? No/denies   Multiple Pain Sites No         Goals Met:  Exercise tolerated well Personal goals reviewed No report of cardiac concerns or symptoms Strength training completed today  Goals Unmet:  Not Applicable  Comments: First full day of exercise!  Patient was oriented to gym and equipment including functions, settings, policies, and procedures.  Patient's individual exercise prescription and treatment plan were reviewed.  All starting workloads were established based on the results of the 6 minute walk test done at initial orientation visit.  The plan for exercise progression was also introduced and progression will be customized based on patient's performance and goals.    Dr. Emily Filbert is Medical Director for [place] and [facility].

## 2016-04-06 ENCOUNTER — Encounter: Payer: 59 | Admitting: *Deleted

## 2016-04-06 DIAGNOSIS — I214 Non-ST elevation (NSTEMI) myocardial infarction: Secondary | ICD-10-CM | POA: Diagnosis not present

## 2016-04-06 NOTE — Progress Notes (Signed)
Daily Session Note  Patient Details  Name: Matthew Arias MRN: [id number] Date of Birth: 1955-11-06 Referring Provider:   Flowsheet Row Cardiac Rehab from 04/02/2016 in [facility] Cardiac and Pulmonary Rehab  Referring Provider  Kathlyn Sacramento MD      Encounter Date: 04/06/2016  Check In:     Session Check In - 04/06/16 0932      Check-In   Staff Present Heath Lark, RN, BSN, CCRP;Mary Kellie Shropshire, RN, BSN, Willette Pa, MA, ACSM RCEP, Exercise Physiologist   Supervising physician immediately available to respond to emergencies See telemetry face sheet for immediately available ER MD   Medication changes reported     No   Fall or balance concerns reported    No   Warm-up and Cool-down Performed on first and last piece of equipment   Resistance Training Performed Yes   VAD Patient? No     Pain Assessment   Currently in Pain? No/denies         Goals Met:  Exercise tolerated well No report of cardiac concerns or symptoms Strength training completed today  Goals Unmet:  Not Applicable  Comments: Doing well with exercise prescription progression.    Dr. Emily Filbert is Medical Director for [place] and [facility].

## 2016-04-09 ENCOUNTER — Encounter: Payer: 59 | Admitting: *Deleted

## 2016-04-09 DIAGNOSIS — I214 Non-ST elevation (NSTEMI) myocardial infarction: Secondary | ICD-10-CM | POA: Diagnosis not present

## 2016-04-09 NOTE — Progress Notes (Signed)
Daily Session Note  Patient Details  Name: Matthew Arias MRN: [id number] Date of Birth: 07-23-1955 Referring Provider:   Flowsheet Row Cardiac Rehab from 04/02/2016 in [facility] Cardiac and Pulmonary Rehab  Referring Provider  Kathlyn Sacramento MD      Encounter Date: 04/09/2016  Check In:     Session Check In - 04/09/16 0801      Check-In   Location [facility]-Cardiac & Pulmonary Rehab   Staff Present Nyoka Cowden, RN, BSN, Willette Pa, MA, ACSM RCEP, Exercise Physiologist;Kelly Amedeo Plenty, BS, ACSM CEP, Exercise Physiologist   Supervising physician immediately available to respond to emergencies See telemetry face sheet for immediately available ER MD   Medication changes reported     No   Fall or balance concerns reported    No   Warm-up and Cool-down Performed on first and last piece of equipment   Resistance Training Performed Yes   VAD Patient? No     Pain Assessment   Currently in Pain? No/denies   Multiple Pain Sites No         Goals Met:  Independence with exercise equipment Exercise tolerated well No report of cardiac concerns or symptoms Strength training completed today  Goals Unmet:  Not Applicable  Comments: Pt able to follow exercise prescription today without complaint.  Will continue to monitor for progression.    Dr. Emily Filbert is Medical Director for [place] and [facility].

## 2016-04-10 ENCOUNTER — Other Ambulatory Visit: Payer: Self-pay | Admitting: *Deleted

## 2016-04-10 ENCOUNTER — Telehealth: Payer: Self-pay | Admitting: Cardiovascular Disease

## 2016-04-10 MED ORDER — METOPROLOL TARTRATE 25 MG PO TABS: 25.0000 mg | ORAL_TABLET | Freq: Two times a day (BID) | ORAL | 3 refills | Status: DC

## 2016-04-10 MED ORDER — ROSUVASTATIN CALCIUM 10 MG PO TABS: 10.0000 mg | ORAL_TABLET | Freq: Every day | ORAL | 3 refills | Status: DC

## 2016-04-10 MED ORDER — CLOPIDOGREL BISULFATE 75 MG PO TABS: 75.0000 mg | ORAL_TABLET | Freq: Every day | ORAL | 3 refills | Status: DC

## 2016-04-10 NOTE — Telephone Encounter (Signed)
°*  STAT* If patient is at the pharmacy, call can be transferred to refill team.   1. Which medications need to be refilled? (please list name of each medication and dose if known) plavix 75 mg po 1 daily lopressor 25 mg po bid                Rosuvastatin 10 mg po daily   2. Which pharmacy/location (including street and city if local pharmacy) is medication to be sent to? [facility] [address]   3. Do they need a 30 day or 90 day supply? 90

## 2016-04-10 NOTE — Telephone Encounter (Signed)
Rx sent [place] pharmacy [facility] [place] for requested medications.

## 2016-04-11 DIAGNOSIS — I214 Non-ST elevation (NSTEMI) myocardial infarction: Secondary | ICD-10-CM | POA: Diagnosis not present

## 2016-04-11 NOTE — Progress Notes (Signed)
Daily Session Note  Patient Details  Name: [name] MRN: [id number] Date of Birth: 1955/06/18 Referring Provider:   Flowsheet Row Cardiac Rehab from 04/02/2016 in [facility] Cardiac and Pulmonary Rehab  Referring Provider  Kathlyn Sacramento MD      Encounter Date: 04/11/2016  Check In:     Session Check In - 04/11/16 0813      Check-In   Location [facility]-Cardiac & Pulmonary Rehab   Staff Present Alberteen Sam, MA, ACSM RCEP, Exercise Physiologist;Amanda Oletta Darter, BA, ACSM CEP, Exercise Physiologist;Other  Jena Gauss RN   Supervising physician immediately available to respond to emergencies See telemetry face sheet for immediately available ER MD   Medication changes reported     No   Fall or balance concerns reported    No   Warm-up and Cool-down Performed on first and last piece of equipment   Resistance Training Performed Yes   VAD Patient? No     Pain Assessment   Currently in Pain? No/denies         Goals Met:  Independence with exercise equipment Exercise tolerated well No report of cardiac concerns or symptoms Strength training completed today  Goals Unmet:  Not Applicable  Comments: Pt able to follow exercise prescription today without complaint.  Will continue to monitor for progression.    Dr. Emily Filbert is Medical Director for [place] and [facility].

## 2016-04-13 ENCOUNTER — Encounter: Payer: 59 | Admitting: *Deleted

## 2016-04-13 DIAGNOSIS — I214 Non-ST elevation (NSTEMI) myocardial infarction: Secondary | ICD-10-CM | POA: Diagnosis not present

## 2016-04-13 NOTE — Progress Notes (Addendum)
Daily Session Note  Patient Details  Name: Matthew Arias MRN: [id number] Date of Birth: 04-03-1956 Referring Provider:   Flowsheet Row Cardiac Rehab from 04/02/2016 in [facility] Cardiac and Pulmonary Rehab  Referring Provider  Kathlyn Sacramento MD      Encounter Date: 04/13/2016  Check In:     Session Check In - 04/13/16 0850      Check-In   Location [facility]-Cardiac & Pulmonary Rehab   Staff Present Heath Lark, RN, BSN, CCRP;Leidi Astle, RN, Levie Heritage, MA, ACSM RCEP, Exercise Physiologist   Supervising physician immediately available to respond to emergencies See telemetry face sheet for immediately available ER MD   Medication changes reported     No   Fall or balance concerns reported    No   Warm-up and Cool-down Performed on first and last piece of equipment   Resistance Training Performed Yes   VAD Patient? No     Pain Assessment   Currently in Pain? No/denies         Goals Met:  Proper associated with RPD/PD & O2 Sat Exercise tolerated well  Goals Unmet:  Not Applicable  Comments:  Pt able to follow exercise prescription today without complaint.  Will continue to monitor for progression. Reviewed home exercise with pt today.  Pt plans to jump rope and lift at home for exercise.  Reviewed THR, pulse, RPE, sign and symptoms, NTG use, and when to call 911 or MD.  Also discussed weather considerations and indoor options.  Pt voiced understanding.  04/13/2016 9:57 AM    Dr. Emily Filbert is Medical Director for [place] and [facility].

## 2016-04-24 ENCOUNTER — Encounter: Payer: Self-pay | Admitting: *Deleted

## 2016-04-24 DIAGNOSIS — I214 Non-ST elevation (NSTEMI) myocardial infarction: Secondary | ICD-10-CM

## 2016-04-24 NOTE — Progress Notes (Signed)
Cardiac Individual Treatment Plan  Patient Details  Name: Matthew Arias MRN: [id number] Date of Birth: 10-22-1955 Referring Provider:   Flowsheet Row Cardiac Rehab from 04/02/2016 in [facility] Cardiac and Pulmonary Rehab  Referring Provider  Kathlyn Sacramento MD      Initial Encounter Date:  Flowsheet Row Cardiac Rehab from 04/02/2016 in [facility] Cardiac and Pulmonary Rehab  Date  04/02/16  Referring Provider  Kathlyn Sacramento MD      Visit Diagnosis: NSTEMI (non-ST elevated myocardial infarction) [facility])  Patient's Home Medications on Admission:  Current Outpatient Prescriptions:  .  aspirin EC 81 MG EC tablet, Take 1 tablet (81 mg total) by mouth daily., Disp: , Rfl:  .  clopidogrel (PLAVIX) 75 MG tablet, Take 1 tablet (75 mg total) by mouth daily with breakfast., Disp: 90 tablet, Rfl: 3 .  metoprolol tartrate (LOPRESSOR) 25 MG tablet, Take 1 tablet (25 mg total) by mouth 2 (two) times daily., Disp: 180 tablet, Rfl: 3 .  nitroGLYCERIN (NITROSTAT) 0.4 MG SL tablet, Place 1 tablet (0.4 mg total) under the tongue every 5 (five) minutes as needed for chest pain., Disp: 20 tablet, Rfl: 0 .  rosuvastatin (CRESTOR) 10 MG tablet, Take 1 tablet (10 mg total) by mouth daily., Disp: 90 tablet, Rfl: 3  Past Medical History: Past Medical History:  Diagnosis Date  . Coronary artery disease    Non-ST elevation myocardial infarction in November 2017. Cardiac catheterization showed occluded first diagonal with faint collaterals. EF was 50-55%. There was mild nonobstructive disease otherwise. He was treated medically.  . Degenerative disc disease, lumbar   . Hyperlipidemia   . Pulmonary nodules     Tobacco Use: History  Smoking Status  . Former Smoker  . Types: Cigarettes  . Quit date: 04/24/2015  Smokeless Tobacco  . Never Used    Comment: used to smoke 1PPD for 40 years    Labs: Recent Review [employer] for ITP Cardiac and Pulmonary Rehab Latest Ref Rng & Units 03/12/2016    Cholestrol 0 - 200 mg/dL 255(H)   LDLCALC 0 - 99 mg/dL 152(H)   HDL >40 mg/dL 39(L)   Trlycerides <150 mg/dL 320(H)   Hemoglobin A1c 4.8 - 5.6 % 5.7(H)       Exercise Target Goals:    Exercise Program Goal: Individual exercise prescription set with THRR, safety & activity barriers. Participant demonstrates ability to understand and report RPE using BORG scale, to self-measure pulse accurately, and to acknowledge the importance of the exercise prescription.  Exercise Prescription Goal: Starting with aerobic activity 30 plus minutes a day, 3 days per week for initial exercise prescription. Provide home exercise prescription and guidelines that participant acknowledges understanding prior to discharge.  Activity Barriers & Risk Stratification:     Activity Barriers & Cardiac Risk Stratification - 04/02/16 1256      Activity Barriers & Cardiac Risk Stratification   Activity Barriers Back Problems;Other (comment)  History of 2 surgeries on lower back.  LImits self to prevent discomfort.   Comments cortisone shot in left shoulder for pain at Thanksgiving   Cardiac Risk Stratification High      6 Minute Walk:     6 Minute Walk    Row Name 04/02/16 1418         6 Minute Walk   Phase Initial     Distance 1664 feet     Walk Time 6 minutes     # of Rest Breaks 0  MPH 3.15     METS 4.16     RPE 9     VO2 Peak 14.57     Symptoms No     Resting HR 58 bpm     Resting BP 144/74     Max Ex. HR 106 bpm     Max Ex. BP 144/68     2 Minute Post BP 142/60        Initial Exercise Prescription:     Initial Exercise Prescription - 04/02/16 1400      Date of Initial Exercise RX and Referring Provider   Date 04/02/16   Referring Provider Kathlyn Sacramento MD     Treadmill   MPH 3   Grade 2   Minutes 15   METs 4.12     NuStep   Level 4   Watts --  80-100 spm   Minutes 15   METs 2.5     REL-XR   Level 3   Watts --  speed >50 rpm   Minutes 15   METs 2      Prescription Details   Frequency (times per week) 3   Duration Progress to 45 minutes of aerobic exercise without signs/symptoms of physical distress     Intensity   THRR 40-80% of Max Heartrate 98-139   Ratings of Perceived Exertion 11-15   Perceived Dyspnea 0-4     Progression   Progression Continue to progress workloads to maintain intensity without signs/symptoms of physical distress.     Resistance Training   Training Prescription Yes   Weight 4 lbs   Reps 10-12      Perform Capillary Blood Glucose checks as needed.  Exercise Prescription Changes:     Exercise Prescription Changes    Row Name 04/02/16 1400 04/04/16 1600 04/13/16 0900 04/18/16 1600       Exercise Review   Progression -  walk test results -  First Full Day of Exercise  - Yes      Response to Exercise   Blood Pressure (Admit) 144/74 116/54  - 120/60    Blood Pressure (Exercise) 144/68 142/70  - 150/80    Blood Pressure (Exit) 142/60 112/64  - 98/56    Heart Rate (Admit) 58 bpm 64 bpm  - 80 bpm    Heart Rate (Exercise) 106 bpm 112 bpm  - 120 bpm    Heart Rate (Exit) 60 bpm 59 bpm  - 63 bpm    Oxygen Saturation (Admit) 98 %  -  -  -    Oxygen Saturation (Exercise) 98 %  -  -  -    Rating of Perceived Exertion (Exercise) 9 12  - 17    Symptoms none none none none    Comments  -  - Home Exercise Guidelines given 04/13/16 Home Exercise Guidelines given 04/13/16    Duration  - Progress to 45 minutes of aerobic exercise without signs/symptoms of physical distress Progress to 45 minutes of aerobic exercise without signs/symptoms of physical distress Progress to 45 minutes of aerobic exercise without signs/symptoms of physical distress    Intensity  - THRR unchanged THRR unchanged THRR unchanged      Progression   Progression  - Continue to progress workloads to maintain intensity without signs/symptoms of physical distress. Continue to progress workloads to maintain intensity without signs/symptoms of  physical distress. Continue to progress workloads to maintain intensity without signs/symptoms of physical distress.    Average METs  - 3.96 3.96 4.07  Resistance Training   Training Prescription  - Yes Yes Yes    Weight  - 4 lbs 4 lbs 4 lbs    Reps  - 10-12 10-12 10-12      Interval Training   Interval Training  -  -  - No      Treadmill   MPH  - '3 3 3    ' Grade  - '2 2 2    ' Minutes  - '15 15 15    ' METs  - 4.12 4.12 4.12      NuStep   Level  -  -  - 4    Minutes  -  -  - 15    METs  -  -  - 3.2      REL-XR   Level  - '3 3 3    ' Minutes  - '15 15 15    ' METs  - 3.8 3.8 4.9      Home Exercise Plan   Plans to continue exercise at  -  - Home  jumping rope and weights Home  jumping rope and weights    Frequency  -  - Add 3 additional days to program exercise sessions. Add 3 additional days to program exercise sessions.       Exercise Comments:     Exercise Comments    Row Name 04/02/16 1421 04/04/16 0904 04/13/16 0957 04/18/16 1616     Exercise Comments Heath's main goal is to know that his heart and rhythm are good to be independent.  First full day of exercise!  Patient was oriented to gym and equipment including functions, settings, policies, and procedures.  Patient's individual exercise prescription and treatment plan were reviewed.  All starting workloads were established based on the results of the 6 minute walk test done at initial orientation visit.  The plan for exercise progression was also introduced and progression will be customized based on patient's performance and goals. Reviewed home exercise with pt today.  Pt plans to jump rope and lift at home for exercise.  Reviewed THR, pulse, RPE, sign and symptoms, NTG use, and when to call 911 or MD.  Also discussed weather considerations and indoor options.  Pt voiced understanding. Jaymin has been doing well with rehab.  This week he is out with his kids going to the beach and to the mountains while they are home from  school.  He is back to jumping rope regularly and plans to start lengthening the time that he does it.  We will continue to monitor his progression.       Discharge Exercise Prescription (Final Exercise Prescription Changes):     Exercise Prescription Changes - 04/18/16 1600      Exercise Review   Progression Yes     Response to Exercise   Blood Pressure (Admit) 120/60   Blood Pressure (Exercise) 150/80   Blood Pressure (Exit) 98/56   Heart Rate (Admit) 80 bpm   Heart Rate (Exercise) 120 bpm   Heart Rate (Exit) 63 bpm   Rating of Perceived Exertion (Exercise) 17   Symptoms none   Comments Home Exercise Guidelines given 04/13/16   Duration Progress to 45 minutes of aerobic exercise without signs/symptoms of physical distress   Intensity THRR unchanged     Progression   Progression Continue to progress workloads to maintain intensity without signs/symptoms of physical distress.   Average METs 4.07     Resistance Training   Training Prescription Yes   Weight  4 lbs   Reps 10-12     Interval Training   Interval Training No     Treadmill   MPH 3   Grade 2   Minutes 15   METs 4.12     NuStep   Level 4   Minutes 15   METs 3.2     REL-XR   Level 3   Minutes 15   METs 4.9     Home Exercise Plan   Plans to continue exercise at Home  jumping rope and weights   Frequency Add 3 additional days to program exercise sessions.      Nutrition:  Target Goals: Understanding of nutrition guidelines, daily intake of sodium <1528m, cholesterol <208m calories 30% from fat and 7% or less from saturated fats, daily to have 5 or more servings of fruits and vegetables.  Biometrics:     Pre Biometrics - 04/02/16 1423      Pre Biometrics   Height 5' 4.5" (1.638 m)   Weight 160 lb (72.6 kg)   Waist Circumference 35 inches   Hip Circumference 37 inches   Waist to Hip Ratio 0.95 %   BMI (Calculated) 27.1   Single Leg Stand 30 seconds       Nutrition Therapy Plan and  Nutrition Goals:     Nutrition Therapy & Goals - 04/02/16 1252      Intervention Plan   Intervention Prescribe, educate and counsel regarding individualized specific dietary modifications aiming towards targeted core components such as weight, hypertension, lipid management, diabetes, heart failure and other comorbidities.   Expected Outcomes Short Term Goal: Understand basic principles of dietary content, such as calories, fat, sodium, cholesterol and nutrients.;Short Term Goal: A plan has been developed with personal nutrition goals set during dietitian appointment.;Long Term Goal: Adherence to prescribed nutrition plan.      Nutrition Discharge: Rate Your Plate Scores:     Nutrition Assessments - 04/02/16 1253      Rate Your Plate Scores   Pre Score 66   Pre Score % 73.3 %      Nutrition Goals Re-Evaluation:   Psychosocial: Target Goals: Acknowledge presence or absence of depression, maximize coping skills, provide positive support system. Participant is able to verbalize types and ability to use techniques and skills needed for reducing stress and depression.  Initial Review & Psychosocial Screening:     Initial Psych Review & Screening - 04/02/16 1254      Initial Review   Current issues with --  None reported     Family Dynamics   Good Support System? Yes  Wife and sons at home     Barriers   Psychosocial barriers to participate in program There are no identifiable barriers or psychosocial needs.;The patient should benefit from training in stress management and relaxation.     Screening Interventions   Interventions Encouraged to exercise      Quality of Life Scores:     Quality of Life - 04/02/16 1254      Quality of Life Scores   Health/Function Pre 30 %   Socioeconomic Pre 30 %   Psych/Spiritual Pre 30 %   Family Pre 30 %   GLOBAL Pre 30 %      PHQ-9: Recent Review Flowsheet Data    [facility] 04/[facility]   Decreased Interest 0    Down, Depressed, Hopeless 0   PHQ - 2 Score 0   Altered sleeping 0   Tired, decreased energy 0  Change in appetite 0   Feeling bad or failure about yourself  0   Trouble concentrating 0   Moving slowly or fidgety/restless 0   Suicidal thoughts 0   PHQ-9 Score 0      Psychosocial Evaluation and Intervention:     Psychosocial Evaluation - 04/09/16 0931      Psychosocial Evaluation & Interventions   Interventions Encouraged to exercise with the program and follow exercise prescription   Comments Counselor met with Mr. Friedt today for initial psychosocial evaluation.  He is a 61 year old (yesterday was his birthday) who had a heart attack in November.  He has a strong support system with a spouse of 60 years and a 41 year old son who lives in the home.  He also has several step-daughters who live close by.  Mr. Viona Gilmore has had (2) lower back surgeries in addition to his cardiac health issues.  He sleeps well and has a good appetite.  He also denies a history of depression or anxiety or any current symptoms.  Mr. Beeck is retired and reports having a "good life" with minimal stress; other than his step-daughter who may have addiction problems impacting the family as they have to care for her 36 year old daughter often.  Mr. Viona Gilmore has goals to "just to get back to normal" and continue exercising as usual.  Staff will follow with him throughout the course of this program.        Psychosocial Re-Evaluation:   Vocational Rehabilitation: Provide vocational rehab assistance to qualifying candidates.   Vocational Rehab Evaluation & Intervention:     Vocational Rehab - 04/02/16 1256      Initial Vocational Rehab Evaluation & Intervention   Assessment shows need for Vocational Rehabilitation No      Education: Education Goals: Education classes will be provided on a weekly basis, covering required topics. Participant will state understanding/return demonstration of topics presented.  Learning  Barriers/Preferences:     Learning Barriers/Preferences - 04/02/16 1256      Learning Barriers/Preferences   Learning Barriers None   Learning Preferences None      Education Topics: General Nutrition Guidelines/Fats and Fiber: -Group instruction provided by verbal, written material, models and posters to present the general guidelines for heart healthy nutrition. Gives an explanation and review of dietary fats and fiber.   Controlling Sodium/Reading Food Labels: -Group verbal and written material supporting the discussion of sodium use in heart healthy nutrition. Review and explanation with models, verbal and written materials for utilization of the food label.   Exercise Physiology & Risk Factors: - Group verbal and written instruction with models to review the exercise physiology of the cardiovascular system and associated critical values. Details cardiovascular disease risk factors and the goals associated with each risk factor.   Aerobic Exercise & Resistance Training: - Gives group verbal and written discussion on the health impact of inactivity. On the components of aerobic and resistive training programs and the benefits of this training and how to safely progress through these programs.   Flexibility, Balance, General Exercise Guidelines: - Provides group verbal and written instruction on the benefits of flexibility and balance training programs. Provides general exercise guidelines with specific guidelines to those with heart or lung disease. Demonstration and skill practice provided.   Stress Management: - Provides group verbal and written instruction about the health risks of elevated stress, cause of high stress, and healthy ways to reduce stress. Flowsheet Row Cardiac Rehab from 04/09/2016 in [facility]  Cardiac and Pulmonary Rehab  Date  04/04/16  Educator  [facility]  Instruction Review Code  2- meets goals/outcomes      Depression: - Provides group verbal and written  instruction on the correlation between heart/lung disease and depressed mood, treatment options, and the stigmas associated with seeking treatment.   Anatomy & Physiology of the Heart: - Group verbal and written instruction and models provide basic cardiac anatomy and physiology, with the coronary electrical and arterial systems. Review of: AMI, Angina, Valve disease, Heart Failure, Cardiac Arrhythmia, Pacemakers, and the ICD.   Cardiac Procedures: - Group verbal and written instruction and models to describe the testing methods done to diagnose heart disease. Reviews the outcomes of the test results. Describes the treatment choices: Medical Management, Angioplasty, or Coronary Bypass Surgery.   Cardiac Medications: - Group verbal and written instruction to review commonly prescribed medications for heart disease. Reviews the medication, class of the drug, and side effects. Includes the steps to properly store meds and maintain the prescription regimen. Flowsheet Row Cardiac Rehab from 04/11/2016 in [facility] Cardiac and Pulmonary Rehab  Date  04/11/16  Educator  TS  Instruction Review Code  2- meets goals/outcomes      Go Sex-Intimacy & Heart Disease, Get SMART - Goal Setting: - Group verbal and written instruction through game format to discuss heart disease and the return to sexual intimacy. Provides group verbal and written material to discuss and apply goal setting through the application of the S.M.A.R.T. Method.   Other Matters of the Heart: - Provides group verbal, written materials and models to describe Heart Failure, Angina, Valve Disease, and Diabetes in the realm of heart disease. Includes description of the disease process and treatment options available to the cardiac patient.   Exercise & Equipment Safety: - Individual verbal instruction and demonstration of equipment use and safety with use of the equipment. Flowsheet Row Cardiac Rehab from 04/09/2016 in [facility] Cardiac and  Pulmonary Rehab  Date  04/02/16  Educator  SB  Instruction Review Code  2- meets goals/outcomes      Infection Prevention: - Provides verbal and written material to individual with discussion of infection control including proper hand washing and proper equipment cleaning during exercise session. Flowsheet Row Cardiac Rehab from 04/09/2016 in [facility] Cardiac and Pulmonary Rehab  Date  04/02/16  Educator  SB  Instruction Review Code  2- meets goals/outcomes      Falls Prevention: - Provides verbal and written material to individual with discussion of falls prevention and safety. Flowsheet Row Cardiac Rehab from 04/09/2016 in [facility] Cardiac and Pulmonary Rehab  Date  04/02/16  Educator  Sb  Instruction Review Code  2- meets goals/outcomes      Diabetes: - Individual verbal and written instruction to review signs/symptoms of diabetes, desired ranges of glucose level fasting, after meals and with exercise. Advice that pre and post exercise glucose checks will be done for 3 sessions at entry of program.    Knowledge Questionnaire Score:     Knowledge Questionnaire Score - 04/02/16 1256      Knowledge Questionnaire Score   Pre Score 14/28      Core Components/Risk Factors/Patient Goals at Admission:     Personal Goals and Risk Factors at Admission - 04/02/16 1253      Core Components/Risk Factors/Patient Goals on Admission    Weight Management Yes;Weight Loss   Intervention Weight Management: Develop a combined nutrition and exercise program designed to reach desired caloric intake, while maintaining appropriate intake  of nutrient and fiber, sodium and fats, and appropriate energy expenditure required for the weight goal.;Weight Management: Provide education and appropriate resources to help participant work on and attain dietary goals.   Admit Weight 160 lb (72.6 kg)   Goal Weight: Short Term 155 lb (70.3 kg)   Goal Weight: Long Term 150 lb (68 kg)   Expected Outcomes Short  Term: Continue to assess and modify interventions until short term weight is achieved;Long Term: Adherence to nutrition and physical activity/exercise program aimed toward attainment of established weight goal;Weight Loss: Understanding of general recommendations for a balanced deficit meal plan, which promotes 1-2 lb weight loss per week and includes a negative energy balance of [phone] kcal/d;Understanding recommendations for meals to include 15-35% energy as protein, 25-35% energy from fat, 35-60% energy from carbohydrates, less than 297m of dietary cholesterol, 20-35 gm of total fiber daily;Understanding of distribution of calorie intake throughout the day with the consumption of 4-5 meals/snacks   Increase Strength and Stamina Yes   Intervention Provide advice, education, support and counseling about physical activity/exercise needs.;Develop an individualized exercise prescription for aerobic and resistive training based on initial evaluation findings, risk stratification, comorbidities and participant's personal goals.   Expected Outcomes Achievement of increased cardiorespiratory fitness and enhanced flexibility, muscular endurance and strength shown through measurements of functional capacity and personal statement of participant.   Hypertension Yes   Intervention Provide education on lifestyle modifcations including regular physical activity/exercise, weight management, moderate sodium restriction and increased consumption of fresh fruit, vegetables, and low fat dairy, alcohol moderation, and smoking cessation.;Monitor prescription use compliance.   Expected Outcomes Short Term: Continued assessment and intervention until BP is < 140/915mHG in hypertensive participants. < 130/8075mG in hypertensive participants with diabetes, heart failure or chronic kidney disease.;Long Term: Maintenance of blood pressure at goal levels.   Lipids Yes   Intervention Provide education and support for participant  on nutrition & aerobic/resistive exercise along with prescribed medications to achieve LDL <35m39mDL >40mg35mExpected Outcomes Short Term: Participant states understanding of desired cholesterol values and is compliant with medications prescribed. Participant is following exercise prescription and nutrition guidelines.;Long Term: Cholesterol controlled with medications as prescribed, with individualized exercise RX and with personalized nutrition plan. Value goals: LDL < 35mg,39m > 40 mg.      Core Components/Risk Factors/Patient Goals Review:    Core Components/Risk Factors/Patient Goals at Discharge (Final Review):    ITP Comments:     ITP Comments    Row Name 04/02/16 1251 04/24/16 0610         ITP Comments Med review completed. Initial ITP created   Documentation of diagnosis found  [facility]. Continue with ITP unless directed changes per Medical Director review.         Comments:

## 2016-04-25 ENCOUNTER — Encounter: Payer: 59 | Attending: Cardiovascular Disease

## 2016-04-25 DIAGNOSIS — I214 Non-ST elevation (NSTEMI) myocardial infarction: Secondary | ICD-10-CM | POA: Insufficient documentation

## 2016-04-26 ENCOUNTER — Other Ambulatory Visit: Payer: 59

## 2016-04-30 ENCOUNTER — Other Ambulatory Visit (INDEPENDENT_AMBULATORY_CARE_PROVIDER_SITE_OTHER): Payer: 59

## 2016-04-30 DIAGNOSIS — I214 Non-ST elevation (NSTEMI) myocardial infarction: Secondary | ICD-10-CM

## 2016-05-01 LAB — HEPATIC FUNCTION PANEL
ALT: 41 IU/L (ref 0–44)
AST: 30 IU/L (ref 0–40)
Albumin: 4 g/dL (ref 3.6–4.8)
Alkaline Phosphatase: 92 IU/L (ref 39–117)
BILIRUBIN, DIRECT: 0.1 mg/dL (ref 0.00–0.40)
Bilirubin Total: 0.3 mg/dL (ref 0.0–1.2)
TOTAL PROTEIN: 6.8 g/dL (ref 6.0–8.5)

## 2016-05-01 LAB — LIPID PANEL
CHOL/HDL RATIO: 3.8 ratio (ref 0.0–5.0)
Cholesterol, Total: 157 mg/dL (ref 100–199)
HDL: 41 mg/dL (ref >39)
LDL CALC: 94 mg/dL (ref 0–99)
Triglycerides: 109 mg/dL (ref 0–149)
VLDL Cholesterol Cal: 22 mg/dL (ref 5–40)

## 2016-05-02 ENCOUNTER — Encounter: Payer: Self-pay | Admitting: *Deleted

## 2016-05-02 DIAGNOSIS — I214 Non-ST elevation (NSTEMI) myocardial infarction: Secondary | ICD-10-CM

## 2016-05-03 ENCOUNTER — Other Ambulatory Visit: Payer: Self-pay | Admitting: *Deleted

## 2016-05-03 MED ORDER — ROSUVASTATIN CALCIUM 20 MG PO TABS
20.0000 mg | ORAL_TABLET | Freq: Every day | ORAL | 3 refills | Status: DC
Start: 2016-05-03 — End: 2017-04-26

## 2016-05-08 ENCOUNTER — Telehealth: Payer: Self-pay | Admitting: *Deleted

## 2016-05-08 ENCOUNTER — Encounter: Payer: Self-pay | Admitting: *Deleted

## 2016-05-08 DIAGNOSIS — I214 Non-ST elevation (NSTEMI) myocardial infarction: Secondary | ICD-10-CM

## 2016-05-08 NOTE — Telephone Encounter (Signed)
Called to let [name] know about a schedule change and he decided that he does not wish to return to rehab.  We will discharge him at this time.

## 2016-05-08 NOTE — Progress Notes (Signed)
Cardiac Individual Treatment Plan  Patient Details  Name: Matthew Arias MRN: [id number] Date of Birth: 04-10-1956 Referring Provider:   Flowsheet Row Cardiac Rehab from 04/02/2016 in [facility] Cardiac and Pulmonary Rehab  Referring Provider  Kathlyn Sacramento MD      Initial Encounter Date:  Flowsheet Row Cardiac Rehab from 04/02/2016 in [facility] Cardiac and Pulmonary Rehab  Date  04/02/16  Referring Provider  Kathlyn Sacramento MD      Visit Diagnosis: NSTEMI (non-ST elevated myocardial infarction) [facility])  Patient's Home Medications on Admission:  Current Outpatient Prescriptions:  .  aspirin EC 81 MG EC tablet, Take 1 tablet (81 mg total) by mouth daily., Disp: , Rfl:  .  clopidogrel (PLAVIX) 75 MG tablet, Take 1 tablet (75 mg total) by mouth daily with breakfast., Disp: 90 tablet, Rfl: 3 .  metoprolol tartrate (LOPRESSOR) 25 MG tablet, Take 1 tablet (25 mg total) by mouth 2 (two) times daily., Disp: 180 tablet, Rfl: 3 .  nitroGLYCERIN (NITROSTAT) 0.4 MG SL tablet, Place 1 tablet (0.4 mg total) under the tongue every 5 (five) minutes as needed for chest pain., Disp: 20 tablet, Rfl: 0 .  rosuvastatin (CRESTOR) 20 MG tablet, Take 1 tablet (20 mg total) by mouth daily., Disp: 90 tablet, Rfl: 3  Past Medical History: Past Medical History:  Diagnosis Date  . Coronary artery disease    Non-ST elevation myocardial infarction in November 2017. Cardiac catheterization showed occluded first diagonal with faint collaterals. EF was 50-55%. There was mild nonobstructive disease otherwise. He was treated medically.  . Degenerative disc disease, lumbar   . Hyperlipidemia   . Pulmonary nodules     Tobacco Use: History  Smoking Status  . Former Smoker  . Types: Cigarettes  . Quit date: 04/24/2015  Smokeless Tobacco  . Never Used    Comment: used to smoke 1PPD for 40 years    Labs: Recent Review Flowsheet Data    Labs for ITP Cardiac and Pulmonary Rehab Latest Ref Rng & Units 03/12/2016 04/30/2016    Cholestrol 100 - 199 mg/dL 255(H) 157   LDLCALC 0 - 99 mg/dL 152(H) 94   HDL >39 mg/dL 39(L) 41   Trlycerides 0 - 149 mg/dL 320(H) 109   Hemoglobin A1c 4.8 - 5.6 % 5.7(H) -       Exercise Target Goals:    Exercise Program Goal: Individual exercise prescription set with THRR, safety & activity barriers. Participant demonstrates ability to understand and report RPE using BORG scale, to self-measure pulse accurately, and to acknowledge the importance of the exercise prescription.  Exercise Prescription Goal: Starting with aerobic activity 30 plus minutes a day, 3 days per week for initial exercise prescription. Provide home exercise prescription and guidelines that participant acknowledges understanding prior to discharge.  Activity Barriers & Risk Stratification:     Activity Barriers & Cardiac Risk Stratification - 04/02/16 1256      Activity Barriers & Cardiac Risk Stratification   Activity Barriers Back Problems;Other (comment)  History of 2 surgeries on lower back.  LImits self to prevent discomfort.   Comments cortisone shot in left shoulder for pain at Thanksgiving   Cardiac Risk Stratification High      6 Minute Walk:     6 Minute Walk    Row Name 04/02/16 1418         6 Minute Walk   Phase Initial     Distance 1664 feet     Walk Time 6 minutes     #  of Rest Breaks 0     MPH 3.15     METS 4.16     RPE 9     VO2 Peak 14.57     Symptoms No     Resting HR 58 bpm     Resting BP 144/74     Max Ex. HR 106 bpm     Max Ex. BP 144/68     2 Minute Post BP 142/60        Initial Exercise Prescription:     Initial Exercise Prescription - 04/02/16 1400      Date of Initial Exercise RX and Referring Provider   Date 04/02/16   Referring Provider Kathlyn Sacramento MD     Treadmill   MPH 3   Grade 2   Minutes 15   METs 4.12     NuStep   Level 4   Watts --  80-100 spm   Minutes 15   METs 2.5     REL-XR   Level 3   Watts --  speed >50 rpm   Minutes 15    METs 2     Prescription Details   Frequency (times per week) 3   Duration Progress to 45 minutes of aerobic exercise without signs/symptoms of physical distress     Intensity   THRR 40-80% of Max Heartrate 98-139   Ratings of Perceived Exertion 11-15   Perceived Dyspnea 0-4     Progression   Progression Continue to progress workloads to maintain intensity without signs/symptoms of physical distress.     Resistance Training   Training Prescription Yes   Weight 4 lbs   Reps 10-12      Perform Capillary Blood Glucose checks as needed.  Exercise Prescription Changes:     Exercise Prescription Changes    Row Name 04/02/16 1400 04/04/16 1600 04/13/16 0900 04/18/16 1600       Exercise Review   Progression -  walk test results -  First Full Day of Exercise  - Yes      Response to Exercise   Blood Pressure (Admit) 144/74 116/54  - 120/60    Blood Pressure (Exercise) 144/68 142/70  - 150/80    Blood Pressure (Exit) 142/60 112/64  - 98/56    Heart Rate (Admit) 58 bpm 64 bpm  - 80 bpm    Heart Rate (Exercise) 106 bpm 112 bpm  - 120 bpm    Heart Rate (Exit) 60 bpm 59 bpm  - 63 bpm    Oxygen Saturation (Admit) 98 %  -  -  -    Oxygen Saturation (Exercise) 98 %  -  -  -    Rating of Perceived Exertion (Exercise) 9 12  - 17    Symptoms none none none none    Comments  -  - Home Exercise Guidelines given 04/13/16 Home Exercise Guidelines given 04/13/16    Duration  - Progress to 45 minutes of aerobic exercise without signs/symptoms of physical distress Progress to 45 minutes of aerobic exercise without signs/symptoms of physical distress Progress to 45 minutes of aerobic exercise without signs/symptoms of physical distress    Intensity  - THRR unchanged THRR unchanged THRR unchanged      Progression   Progression  - Continue to progress workloads to maintain intensity without signs/symptoms of physical distress. Continue to progress workloads to maintain intensity without  signs/symptoms of physical distress. Continue to progress workloads to maintain intensity without signs/symptoms of physical distress.  Average METs  - 3.96 3.96 4.07      Resistance Training   Training Prescription  - Yes Yes Yes    Weight  - 4 lbs 4 lbs 4 lbs    Reps  - 10-12 10-12 10-12      Interval Training   Interval Training  -  -  - No      Treadmill   MPH  - _0 Grade  - _1 Minutes  - _2 METs  - 4.12 4.12 4.12      NuStep   Level  -  -  - 4    Minutes  -  -  - 15    METs  -  -  - 3.2      REL-XR   Level  - _3 Minutes  - _4 METs  - 3.8 3.8 4.9      Home Exercise Plan   Plans to continue exercise at  -  - Home  jumping rope and weights Home  jumping rope and weights    Frequency  -  - Add 3 additional days to program exercise sessions. Add 3 additional days to program exercise sessions.       Exercise Comments:     Exercise Comments    Row Name 04/02/16 1421 04/04/16 0904 04/13/16 0957 04/18/16 1616 05/02/16 1451   Exercise Comments Matthew Arias's main goal is to know that his heart and rhythm are good to be independent.  First full day of exercise!  Patient was oriented to gym and equipment including functions, settings, policies, and procedures.  Patient's individual exercise prescription and treatment plan were reviewed.  All starting workloads were established based on the results of the 6 minute walk test done at initial orientation visit.  The plan for exercise progression was also introduced and progression will be customized based on patient's performance and goals. Reviewed home exercise with pt today.  Pt plans to jump rope and lift at home for exercise.  Reviewed THR, pulse, RPE, sign and symptoms, NTG use, and when to call 911 or MD.  Also discussed weather considerations and indoor options.  Pt voiced understanding. Matthew Arias has been doing well with rehab.  This week he is out with his kids going to the beach and to the mountains  while they are home from school.  He is back to jumping rope regularly and plans to start lengthening the time that he does it.  We will continue to monitor his progression. Matthew Arias continues to be out with his kids since last review.      Discharge Exercise Prescription (Final Exercise Prescription Changes):     Exercise Prescription Changes - 04/18/16 1600      Exercise Review   Progression Yes     Response to Exercise   Blood Pressure (Admit) 120/60   Blood Pressure (Exercise) 150/80   Blood Pressure (Exit) 98/56   Heart Rate (Admit) 80 bpm   Heart Rate (Exercise) 120 bpm   Heart Rate (Exit) 63 bpm   Rating of Perceived Exertion (Exercise) 17   Symptoms none   Comments Home Exercise Guidelines given 04/13/16   Duration Progress to 45 minutes of aerobic exercise without signs/symptoms of physical distress   Intensity THRR unchanged     Progression   Progression Continue to progress workloads to maintain intensity without signs/symptoms  of physical distress.   Average METs 4.07     Resistance Training   Training Prescription Yes   Weight 4 lbs   Reps 10-12     Interval Training   Interval Training No     Treadmill   MPH 3   Grade 2   Minutes 15   METs 4.12     NuStep   Level 4   Minutes 15   METs 3.2     REL-XR   Level 3   Minutes 15   METs 4.9     Home Exercise Plan   Plans to continue exercise at Home  jumping rope and weights   Frequency Add 3 additional days to program exercise sessions.      Nutrition:  Target Goals: Understanding of nutrition guidelines, daily intake of sodium <1544m, cholesterol <2059m calories 30% from fat and 7% or less from saturated fats, daily to have 5 or more servings of fruits and vegetables.  Biometrics:     Pre Biometrics - 04/02/16 1423      Pre Biometrics   Height 5' 4.5" (1.638 m)   Weight 160 lb (72.6 kg)   Waist Circumference 35 inches   Hip Circumference 37 inches   Waist to Hip Ratio 0.95 %   BMI  (Calculated) 27.1   Single Leg Stand 30 seconds       Nutrition Therapy Plan and Nutrition Goals:     Nutrition Therapy & Goals - 04/02/16 1252      Intervention Plan   Intervention Prescribe, educate and counsel regarding individualized specific dietary modifications aiming towards targeted core components such as weight, hypertension, lipid management, diabetes, heart failure and other comorbidities.   Expected Outcomes Short Term Goal: Understand basic principles of dietary content, such as calories, fat, sodium, cholesterol and nutrients.;Short Term Goal: A plan has been developed with personal nutrition goals set during dietitian appointment.;Long Term Goal: Adherence to prescribed nutrition plan.      Nutrition Discharge: Rate Your Plate Scores:     Nutrition Assessments - 04/02/16 1253      Rate Your Plate Scores   Pre Score 66   Pre Score % 73.3 %      Nutrition Goals Re-Evaluation:   Psychosocial: Target Goals: Acknowledge presence or absence of depression, maximize coping skills, provide positive support system. Participant is able to verbalize types and ability to use techniques and skills needed for reducing stress and depression.  Initial Review & Psychosocial Screening:     Initial Psych Review & Screening - 04/02/16 1254      Initial Review   Current issues with --  None reported     Family Dynamics   Good Support System? Yes  Wife and sons at home     Barriers   Psychosocial barriers to participate in program There are no identifiable barriers or psychosocial needs.;The patient should benefit from training in stress management and relaxation.     Screening Interventions   Interventions Encouraged to exercise      Quality of Life Scores:     Quality of Life - 04/02/16 1254      Quality of Life Scores   Health/Function Pre 30 %   Socioeconomic Pre 30 %   Psych/Spiritual Pre 30 %   Family Pre 30 %   GLOBAL Pre 30 %      PHQ-9: Recent  Review Flowsheet Data    [facility] 04/02/2016   Decreased Interest 0   Down,  Depressed, Hopeless 0   PHQ - 2 Score 0   Altered sleeping 0   Tired, decreased energy 0   Change in appetite 0   Feeling bad or failure about yourself  0   Trouble concentrating 0   Moving slowly or fidgety/restless 0   Suicidal thoughts 0   PHQ-9 Score 0      Psychosocial Evaluation and Intervention:     Psychosocial Evaluation - 04/09/16 0931      Psychosocial Evaluation & Interventions   Interventions Encouraged to exercise with the program and follow exercise prescription   Comments Counselor met with Mr. Matthew Arias today for initial psychosocial evaluation.  He is a 61 year old (yesterday was his birthday) who had a heart attack in November.  He has a strong support system with a spouse of 18 years and a 59 year old son who lives in the home.  He also has several step-daughters who live close by.  Mr. Matthew Arias has had (2) lower back surgeries in addition to his cardiac health issues.  He sleeps well and has a good appetite.  He also denies a history of depression or anxiety or any current symptoms.  Mr. Matthew Arias is retired and reports having a "good life" with minimal stress; other than his step-daughter who may have addiction problems impacting the family as they have to care for her 16 year old daughter often.  Mr. Matthew Arias has goals to "just to get back to normal" and continue exercising as usual.  Staff will follow with him throughout the course of this program.        Psychosocial Re-Evaluation:   Vocational Rehabilitation: Provide vocational rehab assistance to qualifying candidates.   Vocational Rehab Evaluation & Intervention:     Vocational Rehab - 04/02/16 1256      Initial Vocational Rehab Evaluation & Intervention   Assessment shows need for Vocational Rehabilitation No      Education: Education Goals: Education classes will be provided on a weekly basis, covering required topics.  Participant will state understanding/return demonstration of topics presented.  Learning Barriers/Preferences:     Learning Barriers/Preferences - 04/02/16 1256      Learning Barriers/Preferences   Learning Barriers None   Learning Preferences None      Education Topics: General Nutrition Guidelines/Fats and Fiber: -Group instruction provided by verbal, written material, models and posters to present the general guidelines for heart healthy nutrition. Gives an explanation and review of dietary fats and fiber.   Controlling Sodium/Reading Food Labels: -Group verbal and written material supporting the discussion of sodium use in heart healthy nutrition. Review and explanation with models, verbal and written materials for utilization of the food label.   Exercise Physiology & Risk Factors: - Group verbal and written instruction with models to review the exercise physiology of the cardiovascular system and associated critical values. Details cardiovascular disease risk factors and the goals associated with each risk factor.   Aerobic Exercise & Resistance Training: - Gives group verbal and written discussion on the health impact of inactivity. On the components of aerobic and resistive training programs and the benefits of this training and how to safely progress through these programs.   Flexibility, Balance, General Exercise Guidelines: - Provides group verbal and written instruction on the benefits of flexibility and balance training programs. Provides general exercise guidelines with specific guidelines to those with heart or lung disease. Demonstration and skill practice provided.   Stress Management: - Provides group verbal and written instruction about the  health risks of elevated stress, cause of high stress, and healthy ways to reduce stress. Flowsheet Row Cardiac Rehab from 04/09/2016 in [facility] Cardiac and Pulmonary Rehab  Date  04/04/16  Educator  [facility]  Instruction Review  Code  2- meets goals/outcomes      Depression: - Provides group verbal and written instruction on the correlation between heart/lung disease and depressed mood, treatment options, and the stigmas associated with seeking treatment.   Anatomy & Physiology of the Heart: - Group verbal and written instruction and models provide basic cardiac anatomy and physiology, with the coronary electrical and arterial systems. Review of: AMI, Angina, Valve disease, Heart Failure, Cardiac Arrhythmia, Pacemakers, and the ICD.   Cardiac Procedures: - Group verbal and written instruction and models to describe the testing methods done to diagnose heart disease. Reviews the outcomes of the test results. Describes the treatment choices: Medical Management, Angioplasty, or Coronary Bypass Surgery.   Cardiac Medications: - Group verbal and written instruction to review commonly prescribed medications for heart disease. Reviews the medication, class of the drug, and side effects. Includes the steps to properly store meds and maintain the prescription regimen. Flowsheet Row Cardiac Rehab from 04/11/2016 in [facility] Cardiac and Pulmonary Rehab  Date  04/11/16  Educator  TS  Instruction Review Code  2- meets goals/outcomes      Go Sex-Intimacy & Heart Disease, Get SMART - Goal Setting: - Group verbal and written instruction through game format to discuss heart disease and the return to sexual intimacy. Provides group verbal and written material to discuss and apply goal setting through the application of the S.M.A.R.T. Method.   Other Matters of the Heart: - Provides group verbal, written materials and models to describe Heart Failure, Angina, Valve Disease, and Diabetes in the realm of heart disease. Includes description of the disease process and treatment options available to the cardiac patient.   Exercise & Equipment Safety: - Individual verbal instruction and demonstration of equipment use and safety with  use of the equipment. Flowsheet Row Cardiac Rehab from 04/09/2016 in [facility] Cardiac and Pulmonary Rehab  Date  04/02/16  Educator  SB  Instruction Review Code  2- meets goals/outcomes      Infection Prevention: - Provides verbal and written material to individual with discussion of infection control including proper hand washing and proper equipment cleaning during exercise session. Flowsheet Row Cardiac Rehab from 04/09/2016 in [facility] Cardiac and Pulmonary Rehab  Date  04/02/16  Educator  SB  Instruction Review Code  2- meets goals/outcomes      Falls Prevention: - Provides verbal and written material to individual with discussion of falls prevention and safety. Flowsheet Row Cardiac Rehab from 04/09/2016 in [facility] Cardiac and Pulmonary Rehab  Date  04/02/16  Educator  Sb  Instruction Review Code  2- meets goals/outcomes      Diabetes: - Individual verbal and written instruction to review signs/symptoms of diabetes, desired ranges of glucose level fasting, after meals and with exercise. Advice that pre and post exercise glucose checks will be done for 3 sessions at entry of program.    Knowledge Questionnaire Score:     Knowledge Questionnaire Score - 04/02/16 1256      Knowledge Questionnaire Score   Pre Score 14/28      Core Components/Risk Factors/Patient Goals at Admission:     Personal Goals and Risk Factors at Admission - 04/02/16 1253      Core Components/Risk Factors/Patient Goals on Admission    Weight Management Yes;Weight  Loss   Intervention Weight Management: Develop a combined nutrition and exercise program designed to reach desired caloric intake, while maintaining appropriate intake of nutrient and fiber, sodium and fats, and appropriate energy expenditure required for the weight goal.;Weight Management: Provide education and appropriate resources to help participant work on and attain dietary goals.   Admit Weight 160 lb (72.6 kg)   Goal Weight: Short Term  155 lb (70.3 kg)   Goal Weight: Long Term 150 lb (68 kg)   Expected Outcomes Short Term: Continue to assess and modify interventions until short term weight is achieved;Long Term: Adherence to nutrition and physical activity/exercise program aimed toward attainment of established weight goal;Weight Loss: Understanding of general recommendations for a balanced deficit meal plan, which promotes 1-2 lb weight loss per week and includes a negative energy balance of [id number] kcal/d;Understanding recommendations for meals to include 15-35% energy as protein, 25-35% energy from fat, 35-60% energy from carbohydrates, less than 23m of dietary cholesterol, 20-35 gm of total fiber daily;Understanding of distribution of calorie intake throughout the day with the consumption of 4-5 meals/snacks   Increase Strength and Stamina Yes   Intervention Provide advice, education, support and counseling about physical activity/exercise needs.;Develop an individualized exercise prescription for aerobic and resistive training based on initial evaluation findings, risk stratification, comorbidities and participant's personal goals.   Expected Outcomes Achievement of increased cardiorespiratory fitness and enhanced flexibility, muscular endurance and strength shown through measurements of functional capacity and personal statement of participant.   Hypertension Yes   Intervention Provide education on lifestyle modifcations including regular physical activity/exercise, weight management, moderate sodium restriction and increased consumption of fresh fruit, vegetables, and low fat dairy, alcohol moderation, and smoking cessation.;Monitor prescription use compliance.   Expected Outcomes Short Term: Continued assessment and intervention until BP is < 140/977mHG in hypertensive participants. < 130/8066mG in hypertensive participants with diabetes, heart failure or chronic kidney disease.;Long Term: Maintenance of blood pressure at goal  levels.   Lipids Yes   Intervention Provide education and support for participant on nutrition & aerobic/resistive exercise along with prescribed medications to achieve LDL <70m27mDL >40mg45mExpected Outcomes Short Term: Participant states understanding of desired cholesterol values and is compliant with medications prescribed. Participant is following exercise prescription and nutrition guidelines.;Long Term: Cholesterol controlled with medications as prescribed, with individualized exercise RX and with personalized nutrition plan. Value goals: LDL < 70mg,12m > 40 mg.      Core Components/Risk Factors/Patient Goals Review:    Core Components/Risk Factors/Patient Goals at Discharge (Final Review):    ITP Comments:     ITP Comments    Row Name 04/02/16 1251 04/24/16 0610 05/08/16 1554       ITP Comments Med review completed. Initial ITP created   Documentation of diagnosis found  [facility] day [facility]. Continue with ITP unless directed changes per Medical Director review. Called to let Matthew kRacielabout a schedule change and he decided that he does not wish to return to rehab.  We will discharge him at this time.        Comments: Discharge ITP

## 2016-05-08 NOTE — Progress Notes (Signed)
Discharge Summary  Patient Details  Name: Matthew Arias MRN: [id number] Date of Birth: [birth year] Referring Provider:   Flowsheet Row Cardiac Rehab from 04/02/2016 in [facility]  Referring Provider  [name] MD       Number of Visits: 9  Reason for Discharge:  Early Exit:  Personal  Smoking History:  History  Smoking Status  . Former Smoker  . Types: Cigarettes  . Quit date: 04/24/2015  Smokeless Tobacco  . Never Used    Comment: used to smoke 1PPD for 40 years    Diagnosis:  NSTEMI (non-ST elevated myocardial infarction) (HCC)  ADL [facility]SD:   Initial Exercise Prescription:     Initial Exercise Prescription - 04/02/16 1400      Date of Initial Exercise RX and Referring Provider   Date 04/02/16   Referring Provider [name] MD     Treadmill   MPH 3   Grade 2   Minutes 15   METs 4.12     NuStep   Level 4   Watts --  80-100 spm   Minutes 15   METs 2.5     REL-XR   Level 3   Watts --  speed >50 rpm   Minutes 15   METs 2     Prescription Details   Frequency (times per week) 3   Duration Progress to 45 minutes of aerobic exercise without signs/symptoms of physical distress     Intensity   THRR 40-80% of Max Heartrate 98-139   Ratings of Perceived Exertion 11-15   Perceived Dyspnea 0-4     Progression   Progression Continue to progress workloads to maintain intensity without signs/symptoms of physical distress.     Resistance Training   Training Prescription Yes   Weight 4 lbs   Reps 10-12      Discharge Exercise Prescription (Final Exercise Prescription Changes):     Exercise Prescription Changes - 04/18/16 1600      Exercise Review   Progression Yes     Response to Exercise   Blood Pressure (Admit) 120/60   Blood Pressure (Exercise) 150/80   Blood Pressure (Exit) 98/56   Heart Rate (Admit) 80 bpm   Heart Rate (Exercise) 120 bpm   Heart Rate (Exit) 63 bpm   Rating of Perceived Exertion (Exercise)  17   Symptoms none   Comments Home Exercise Guidelines given 04/13/16   Duration Progress to 45 minutes of aerobic exercise without signs/symptoms of physical distress   Intensity THRR unchanged     Progression   Progression Continue to progress workloads to maintain intensity without signs/symptoms of physical distress.   Average METs 4.07     Resistance Training   Training Prescription Yes   Weight 4 lbs   Reps 10-12     Interval Training   Interval Training No     Treadmill   MPH 3   Grade 2   Minutes 15   METs 4.12     NuStep   Level 4   Minutes 15   METs 3.2     REL-XR   Level 3   Minutes 15   METs 4.9     Home Exercise Plan   Plans to continue exercise at Home  jumping rope and weights   Frequency Add 3 additional days to program exercise sessions.      Functional Capacity:     6 Minute Walk    Row Name 04/02/16 1418  6 Minute Walk   Phase Initial     Distance 1664 feet     Walk Time 6 minutes     # of Rest Breaks 0     MPH 3.15     METS 4.16     RPE 9     VO2 Peak 14.57     Symptoms No     Resting HR 58 bpm     Resting BP 144/74     Max Ex. HR 106 bpm     Max Ex. BP 144/68     2 Minute Post BP 142/60        Psychological, QOL, Others - Outcomes: PHQ 2/9: Depression screen PHQ 2/9 04/02/2016  Decreased Interest 0  Down, Depressed, Hopeless 0  PHQ - 2 Score 0  Altered sleeping 0  Tired, decreased energy 0  Change in appetite 0  Feeling bad or failure about yourself  0  Trouble concentrating 0  Moving slowly or fidgety/restless 0  Suicidal thoughts 0  PHQ-9 Score 0    Quality of Life:     Quality of Life - 04/02/16 1254      Quality of Life Scores   Health/Function Pre 30 %   Socioeconomic Pre 30 %   Psych/Spiritual Pre 30 %   Family Pre 30 %   GLOBAL Pre 30 %      Personal Goals: Goals established at orientation with interventions provided to work toward goal.     Personal Goals and Risk Factors at  Admission - 04/02/16 1253      Core Components/Risk Factors/Patient Goals on Admission    Weight Management Yes;Weight Loss   Intervention Weight Management: Develop a combined nutrition and exercise program designed to reach desired caloric intake, while maintaining appropriate intake of nutrient and fiber, sodium and fats, and appropriate energy expenditure required for the weight goal.;Weight Management: Provide education and appropriate resources to help participant work on and attain dietary goals.   Admit Weight 160 lb (72.6 kg)   Goal Weight: Short Term 155 lb (70.3 kg)   Goal Weight: Long Term 150 lb (68 kg)   Expected Outcomes Short Term: Continue to assess and modify interventions until short term weight is achieved;Long Term: Adherence to nutrition and physical activity/exercise program aimed toward attainment of established weight goal;Weight Loss: Understanding of general recommendations for a balanced deficit meal plan, which promotes 1-2 lb weight loss per week and includes a negative energy balance of [phone] kcal/d;Understanding recommendations for meals to include 15-35% energy as protein, 25-35% energy from fat, 35-60% energy from carbohydrates, less than 200mg  of dietary cholesterol, 20-35 gm of total fiber daily;Understanding of distribution of calorie intake throughout the day with the consumption of 4-5 meals/snacks   Increase Strength and Stamina Yes   Intervention Provide advice, education, support and counseling about physical activity/exercise needs.;Develop an individualized exercise prescription for aerobic and resistive training based on initial evaluation findings, risk stratification, comorbidities and participant's personal goals.   Expected Outcomes Achievement of increased cardiorespiratory fitness and enhanced flexibility, muscular endurance and strength shown through measurements of functional capacity and personal statement of participant.   Hypertension Yes    Intervention Provide education on lifestyle modifcations including regular physical activity/exercise, weight management, moderate sodium restriction and increased consumption of fresh fruit, vegetables, and low fat dairy, alcohol moderation, and smoking cessation.;Monitor prescription use compliance.   Expected Outcomes Short Term: Continued assessment and intervention until BP is < 140/9690mm HG in hypertensive participants. < 130/5480mm HG  in hypertensive participants with diabetes, heart failure or chronic kidney disease.;Long Term: Maintenance of blood pressure at goal levels.   Lipids Yes   Intervention Provide education and support for participant on nutrition & aerobic/resistive exercise along with prescribed medications to achieve LDL 70mg , HDL >40mg .   Expected Outcomes Short Term: Participant states understanding of desired cholesterol values and is compliant with medications prescribed. Participant is following exercise prescription and nutrition guidelines.;Long Term: Cholesterol controlled with medications as prescribed, with individualized exercise RX and with personalized nutrition plan. Value goals: LDL < 70mg , HDL > 40 mg.       Personal Goals Discharge:   Nutrition & Weight - Outcomes:     Pre Biometrics - 04/02/16 1423      Pre Biometrics   Height 5' 4.5" (1.638 m)   Weight 160 lb (72.6 kg)   Waist Circumference 35 inches   Hip Circumference 37 inches   Waist to Hip Ratio 0.95 %   BMI (Calculated) 27.1   Single Leg Stand 30 seconds       Nutrition:     Nutrition Therapy & Goals - 04/02/16 1252      Intervention Plan   Intervention Prescribe, educate and counsel regarding individualized specific dietary modifications aiming towards targeted core components such as weight, hypertension, lipid management, diabetes, heart failure and other comorbidities.   Expected Outcomes Short Term Goal: Understand basic principles of dietary content, such as calories, fat, sodium,  cholesterol and nutrients.;Short Term Goal: A plan has been developed with personal nutrition goals set during dietitian appointment.;Long Term Goal: Adherence to prescribed nutrition plan.      Nutrition Discharge:     Nutrition Assessments - 04/02/16 1253      Rate Your Plate Scores   Pre Score 66   Pre Score % 73.3 %      Education Questionnaire Score:     Knowledge Questionnaire Score - 04/02/16 1256      Knowledge Questionnaire Score   Pre Score 14/28      Goals reviewed with patient; copy given to patient.

## 2016-06-22 ENCOUNTER — Encounter: Payer: Self-pay | Admitting: Cardiovascular Disease

## 2016-06-22 ENCOUNTER — Ambulatory Visit (INDEPENDENT_AMBULATORY_CARE_PROVIDER_SITE_OTHER): Payer: 59 | Admitting: Cardiovascular Disease

## 2016-06-22 VITALS — BP 120/74 | HR 66 | Ht 64.0 in | Wt 164.0 lb

## 2016-06-22 DIAGNOSIS — E782 Mixed hyperlipidemia: Secondary | ICD-10-CM

## 2016-06-22 DIAGNOSIS — I2510 Atherosclerotic heart disease of native coronary artery without angina pectoris: Secondary | ICD-10-CM

## 2016-06-22 NOTE — Patient Instructions (Signed)
Medication Instructions: Continue same medications.   Labwork: None.   Procedures/Testing: None.   Follow-Up: 9 months with Dr. Fredonia Casalino.   Any Additional Special Instructions Will Be Listed Below (If Applicable).     If you need a refill on your cardiac medications before your next appointment, please call your pharmacy.   

## 2016-06-22 NOTE — Progress Notes (Signed)
Cardiology Office Note   Date:  06/22/2016   ID:  Matthew Reiningony L Funes, DOB [birth year], MRN [id number]  PCP:  Lynnea FerrierBERT J KLEIN III, MD  Cardiologist:   Lorine BearsMuhammad Kolina Kube, MD   Chief Complaint  Patient presents with  . OTHER    3 month f/u no complaints today. Meds reviewed verbally with pt.      History of Present Illness: Matthew Arias is a 61 y.o. male who presents for a follow-up visit regarding coronary artery disease. He has overall been healthy throughout his life and very active. He presented in 02/2016 with NSTEMI.  Cardiac catheterization  showed an occluded first diagonal with mild disease elsewhere. There was faint collaterals and the vessel was overall 2 mm in diameter with ostial location. I elected to treat him medically. Ejection fraction was 50-55%.   He has been doing very well and denies any chest pain or shortness of breath. He continues to be very active and exercises 7 days a week with no significant limitations. He had fever with atorvastatin but is doing well on rosuvastatin.  Past Medical History:  Diagnosis Date  . Coronary artery disease    Non-ST elevation myocardial infarction in November 2017. Cardiac catheterization showed occluded first diagonal with faint collaterals. EF was 50-55%. There was mild nonobstructive disease otherwise. He was treated medically.  . Degenerative disc disease, lumbar   . Hyperlipidemia   . Pulmonary nodules     Past Surgical History:  Procedure Laterality Date  . CARDIAC CATHETERIZATION N/A 03/12/2016   Procedure: Left Heart Cath and Coronary Angiography;  Surgeon: Iran OuchMuhammad A Mikyle Sox, MD;  Location: [facility] INVASIVE CV LAB;  Service: Cardiovascular;  Laterality: N/A;  . CARPAL TUNNEL RELEASE    . COLONOSCOPY    . LUMBAR LAMINECTOMY    . TONSILLECTOMY       Current Outpatient Prescriptions  Medication Sig Dispense Refill  . aspirin EC 81 MG EC tablet Take 1 tablet (81 mg total) by mouth daily.    . clopidogrel (PLAVIX) 75 MG tablet  Take 1 tablet (75 mg total) by mouth daily with breakfast. 90 tablet 3  . metoprolol tartrate (LOPRESSOR) 25 MG tablet Take 1 tablet (25 mg total) by mouth 2 (two) times daily. 180 tablet 3  . nitroGLYCERIN (NITROSTAT) 0.4 MG SL tablet Place 1 tablet (0.4 mg total) under the tongue every 5 (five) minutes as needed for chest pain. 20 tablet 0  . rosuvastatin (CRESTOR) 20 MG tablet Take 1 tablet (20 mg total) by mouth daily. 90 tablet 3   No current facility-administered medications for this visit.     Allergies:   Atorvastatin and Codeine    Social History:  The patient  reports that he quit smoking about 13 months ago. His smoking use included Cigarettes. He has never used smokeless tobacco. He reports that he drinks alcohol. He reports that he does not use drugs.   Family History:  The patient's family history includes Arthritis in his mother; CAD in his father.    ROS:  Please see the history of present illness.   Otherwise, review of systems are positive for none.   All other systems are reviewed and negative.    PHYSICAL EXAM: VS:  BP 120/74 (BP Location: Left Arm, Patient Position: Sitting, Cuff Size: Normal)   Pulse 66   Ht 5\' 4"  (1.626 m)   Wt 164 lb (74.4 kg)   BMI 28.15 kg/m  , BMI Body mass index is 28.15 kg/m.  GEN: Well nourished, well developed, in no acute distress  HEENT: normal  Neck: no JVD, carotid bruits, or masses Cardiac: RRR; no murmurs, rubs, or gallops,no edema  Respiratory:  clear to auscultation bilaterally, normal work of breathing GI: soft, nontender, nondistended, + BS MS: no deformity or atrophy  Skin: warm and dry, no rash Neuro:  Strength and sensation are intact Psych: euthymic mood, full affect   EKG:  EKG is not ordered today.    Recent Labs: 03/13/2016: BUN 12; Creatinine, Ser 1.01; Hemoglobin 14.1; Platelets 230; Potassium 3.9; Sodium 136 04/30/2016: ALT 41    Lipid Panel    Component Value Date/Time   CHOL 157 04/30/2016 0757    TRIG 109 04/30/2016 0757   HDL 41 04/30/2016 0757   CHOLHDL 3.8 04/30/2016 0757   CHOLHDL 6.5 03/12/2016 1403   VLDL 64 (H) 03/12/2016 1403   LDLCALC 94 04/30/2016 0757      Wt Readings from Last 3 Encounters:  06/22/16 164 lb (74.4 kg)  04/02/16 160 lb (72.6 kg)  03/20/16 159 lb 4 oz (72.2 kg)      No flowsheet data found.    ASSESSMENT AND PLAN:  1. Coronary artery disease involving native coronary arteries without angina: The culprit was an occluded first diagonal. He was treated medically and overall he is doing very well with no anginal symptoms. He attended cardiac rehabilitation. He continues to be very active. I am planning to treat him with dual antiplatelet therapy until the end of the year.   2. Hyperlipidemia: Previous LDL was 154. This improved to 94 with rosuvastatin. The dose was subsequently increased to 20 mg to try to achieve an LDL below 70.    Disposition:   FU with me in 9 months  Signed,  [name], MD  06/22/2016 10:42 AM    [facility]

## 2016-07-16 ENCOUNTER — Other Ambulatory Visit: Payer: Self-pay | Admitting: Internal Medicine

## 2016-07-16 DIAGNOSIS — M5136 Other intervertebral disc degeneration, lumbar region: Secondary | ICD-10-CM

## 2016-07-16 DIAGNOSIS — M51369 Other intervertebral disc degeneration, lumbar region without mention of lumbar back pain or lower extremity pain: Secondary | ICD-10-CM

## 2016-07-19 ENCOUNTER — Ambulatory Visit
Admission: RE | Admit: 2016-07-19 | Discharge: 2016-07-19 | Disposition: A | Discharge status: Home / Self Care | Payer: 59 | Source: Ambulatory Visit | Attending: Internal Medicine | Admitting: Internal Medicine

## 2016-07-19 DIAGNOSIS — M5136 Other intervertebral disc degeneration, lumbar region: Secondary | ICD-10-CM | POA: Diagnosis present

## 2016-07-19 DIAGNOSIS — M48061 Spinal stenosis, lumbar region without neurogenic claudication: Secondary | ICD-10-CM | POA: Diagnosis not present

## 2016-07-19 DIAGNOSIS — M47896 Other spondylosis, lumbar region: Secondary | ICD-10-CM | POA: Insufficient documentation

## 2016-07-19 LAB — POCT I-STAT CREATININE: Creatinine, Ser: 1.1 mg/dL (ref 0.61–1.24)

## 2016-07-19 MED ORDER — GADOBENATE DIMEGLUMINE 529 MG/ML IV SOLN
15.0000 mL | Freq: Once | INTRAVENOUS | Status: AC | PRN
  Administered 2016-07-19: 15 mL via INTRAVENOUS

## 2017-01-16 ENCOUNTER — Telehealth: Payer: Self-pay | Admitting: *Deleted

## 2017-01-16 NOTE — Telephone Encounter (Signed)
Received referral for low dose lung cancer screening CT scan. Contacted patient and reviewed process for screening. Noted patient had CT scan of chest 03/28/16. Will contact patient closer to date that he will be eligible for lung screening scan to coordinate screening appointment.

## 2017-03-22 ENCOUNTER — Telehealth: Payer: Self-pay | Admitting: *Deleted

## 2017-03-22 NOTE — Telephone Encounter (Signed)
Received referral for low dose lung cancer screening CT scan. Patient desires to consider having scan in January.

## 2017-04-05 ENCOUNTER — Other Ambulatory Visit: Payer: Self-pay | Admitting: Cardiovascular Disease

## 2017-04-26 ENCOUNTER — Telehealth: Payer: Self-pay | Admitting: *Deleted

## 2017-04-26 ENCOUNTER — Other Ambulatory Visit: Payer: Self-pay | Admitting: Cardiovascular Disease

## 2017-04-26 NOTE — Telephone Encounter (Signed)
Called patient back "in January" as requested by patient to consider lung screening scan. Patient request to follow up after scheduled appt with Dr. Graciela HusbandsKlein where he would like to discuss the screening scan further.

## 2017-05-10 ENCOUNTER — Ambulatory Visit (INDEPENDENT_AMBULATORY_CARE_PROVIDER_SITE_OTHER): Payer: 59 | Admitting: Cardiovascular Disease

## 2017-05-10 ENCOUNTER — Encounter: Payer: Self-pay | Admitting: Cardiovascular Disease

## 2017-05-10 VITALS — BP 118/64 | HR 67 | Ht 64.0 in | Wt 171.5 lb

## 2017-05-10 DIAGNOSIS — I2510 Atherosclerotic heart disease of native coronary artery without angina pectoris: Secondary | ICD-10-CM | POA: Diagnosis not present

## 2017-05-10 DIAGNOSIS — E782 Mixed hyperlipidemia: Secondary | ICD-10-CM | POA: Diagnosis not present

## 2017-05-10 DIAGNOSIS — I10 Essential (primary) hypertension: Secondary | ICD-10-CM | POA: Diagnosis not present

## 2017-05-10 MED ORDER — ICOSAPENT ETHYL 1 G PO CAPS: 2.0000 | ORAL_CAPSULE | Freq: Two times a day (BID) | ORAL | 3 refills | Status: DC

## 2017-05-10 NOTE — Patient Instructions (Addendum)
Medication Instructions:  Your physician has recommended you make the following change in your medication:  DECREASE metoprolol to 1/2 tablet twice daily for 2 days then STOP taking metoprolol STOP taking plavix START taking vascepa 2g twice daily with meals   Labwork: Fasting lipid and liver profile in 6 weeks at the [facility]. No appt needed. Nothing to eat or drink after midnight the evening before your labs.   Testing/Procedures: none  Follow-Up: Your physician wants you to follow-up in: 1 year with Dr. Kirke Corin.  You will receive a reminder letter in the mail two months in advance. If you don't receive a letter, please call our office to schedule the follow-up appointment.   Any Other Special Instructions Will Be Listed Below (If Applicable).     If you need a refill on your cardiac medications before your next appointment, please call your pharmacy.  Icosapent ethyl capsules What is this medicine? ICOSAPENT ETHYL (eye KOE sa pent eth il) contains essential fats. It is used to treat high triglyceride levels. This medicine may be used for other purposes; ask your health care provider or pharmacist if you have questions. COMMON BRAND NAME(S): VASCEPA What should I tell my health care provider before I take this medicine? They need to know if you have any of these conditions: -bleeding disorders -liver disease -an unusual or allergic reaction to icosapent ethyl, fish, shellfish, other medicines, foods, dyes, or preservatives -pregnant or trying to get pregnant -breast-feeding How should I use this medicine? Take this medicine by mouth with a glass of water. Follow the directions on the prescription label. Take this medicine with food. Do not cut, crush or chew this medicine. Take your medicine at regular intervals. Do not take it more often than directed. Do not stop taking except on your doctor's advice. Talk to your pediatrician regarding the use of this medicine in  children. Special care may be needed. Overdosage: If you think you have taken too much of this medicine contact a poison control center or emergency room at once. NOTE: This medicine is only for you. Do not share this medicine with others. What if I miss a dose? If you miss a dose, take it as soon as you can. If it is almost time for your next dose, take only that dose. Do not take double or extra doses. What may interact with this medicine? This medicine may interact with the following medications: -aspirin and aspirin-like medicines -beta-blockers like metoprolol and propranolol -certain medicines that treat or prevent blood clots like warfarin, enoxaparin, dalteparin, apixaban, dabigatran, and rivaroxaban -diuretics -male hormones, like estrogens and birth control pills This list may not describe all possible interactions. Give your health care provider a list of all the medicines, herbs, non-prescription drugs, or dietary supplements you use. Also tell them if you smoke, drink alcohol, or use illegal drugs. Some items may interact with your medicine. What should I watch for while using this medicine? You may need blood work done while you are taking this medicine. Follow a good diet and exercise plan. Taking this medicine does not replace a healthy lifestyle. Some foods that have omega-3 fatty acids naturally are fatty fish like albacore tuna, halibut, herring, mackerel, lake trout, salmon, and sardines. If you are scheduled for any medical or dental procedure, tell your healthcare provider that you are taking this medicine. You may need to stop taking this medicine before the procedure. What side effects may I notice from receiving this medicine? Side effects that  you should report to your doctor or health care professional as soon as possible: -allergic reactions like skin rash, itching or hives, swelling of the face, lips, or tongue -breathing problems -unusual bleeding or  bruising Side effects that usually do not require medical attention (report to your doctor or health care professional if they continue or are bothersome): -joint pain -sore throat This list may not describe all possible side effects. Call your doctor for medical advice about side effects. You may report side effects to FDA at 1-800-FDA-1088. Where should I keep my medicine? Keep out of the reach of children. Store at room temperature between 15 and 30 degrees C (59 and 86 degrees F). Throw away any unused medicine after the expiration date. NOTE: This sheet is a summary. It may not cover all possible information. If you have questions about this medicine, talk to your doctor, pharmacist, or health care provider.  2018 Elsevier/Gold Standard (2015-05-12 13:40:36)

## 2017-05-10 NOTE — Progress Notes (Signed)
Cardiology Office Note   Date:  05/10/2017   ID:  Matthew Arias, DOB 1955/11/04, MRN [id number]  PCP:  [name], MD  Cardiologist:   [name], MD   Chief Complaint  Patient presents with  . other    9 month f/u no complaints today. Meds reviewed verbally with pt.      History of Present Illness: Matthew Arias is a 62 y.o. male who presents for a follow-up visit regarding coronary artery disease. He has overall been healthy throughout his life and very active. He presented in 02/2016 with NSTEMI.  Cardiac catheterization  showed an occluded first diagonal with mild disease elsewhere. There was faint collaterals and the vessel was overall 2 mm in diameter with ostial location. I elected to treat him medically. Ejection fraction was 50-55%.   He has been doing extremely well with no chest pain, shortness of breath or palpitations.  He takes his medications regularly.  He exercises on a daily basis for at least 30 minutes with no exertional symptoms. He gained about 15 pounds over the last 2 years.  Past Medical History:  Diagnosis Date  . Coronary artery disease    Non-ST elevation myocardial infarction in November 2017. Cardiac catheterization showed occluded first diagonal with faint collaterals. EF was 50-55%. There was mild nonobstructive disease otherwise. He was treated medically.  . Degenerative disc disease, lumbar   . Hyperlipidemia   . Pulmonary nodules     Past Surgical History:  Procedure Laterality Date  . CARDIAC CATHETERIZATION N/A 03/12/2016   Procedure: Left Heart Cath and Coronary Angiography;  Surgeon: Iran Ouch, MD;  Location: [facility] INVASIVE CV LAB;  Service: Cardiovascular;  Laterality: N/A;  . CARPAL TUNNEL RELEASE    . COLONOSCOPY    . LUMBAR LAMINECTOMY    . TONSILLECTOMY       Current Outpatient Medications  Medication Sig Dispense Refill  . aspirin EC 81 MG EC tablet Take 1 tablet (81 mg total) by mouth daily.    . clopidogrel  (PLAVIX) 75 MG tablet TAKE 1 TABLET (75 MG TOTAL) BY MOUTH DAILY WITH BREAKFAST. 90 tablet 3  . metoprolol tartrate (LOPRESSOR) 25 MG tablet TAKE 1 TABLET (25 MG TOTAL) BY MOUTH 2 (TWO) TIMES DAILY. 180 tablet 3  . nitroGLYCERIN (NITROSTAT) 0.4 MG SL tablet Place 1 tablet (0.4 mg total) under the tongue every 5 (five) minutes as needed for chest pain. 20 tablet 0  . rosuvastatin (CRESTOR) 20 MG tablet TAKE 1 TABLET (20 MG TOTAL) BY MOUTH DAILY. 90 tablet 3   No current facility-administered medications for this visit.     Allergies:   Atorvastatin and Codeine    Social History:  The patient  reports that he quit smoking about 2 years ago. His smoking use included cigarettes. he has never used smokeless tobacco. He reports that he drinks alcohol. He reports that he does not use drugs.   Family History:  The patient's family history includes Arthritis in his mother; CAD in his father.    ROS:  Please see the history of present illness.   Otherwise, review of systems are positive for none.   All other systems are reviewed and negative.    PHYSICAL EXAM: VS:  BP 118/64 (BP Location: Left Arm, Patient Position: Sitting, Cuff Size: Normal)   Pulse 67   Ht 5\' 4"  (1.626 m)   Wt 171 lb 8 oz (77.8 kg)   BMI 29.44 kg/m  ,  BMI Body mass index is 29.44 kg/m. GEN: Well nourished, well developed, in no acute distress  HEENT: normal  Neck: no JVD, carotid bruits, or masses Cardiac: RRR; no murmurs, rubs, or gallops,no edema  Respiratory:  clear to auscultation bilaterally, normal work of breathing GI: soft, nontender, nondistended, + BS MS: no deformity or atrophy  Skin: warm and dry, no rash Neuro:  Strength and sensation are intact Psych: euthymic mood, full affect   EKG:  EKG is  ordered today. EKG showed normal sinus rhythm with no significant ST or T wave changes.   Recent Labs: 07/19/2016: Creatinine, Ser 1.10    Lipid Panel    Component Value Date/Time   CHOL 157 04/30/2016  0757   TRIG 109 04/30/2016 0757   HDL 41 04/30/2016 0757   CHOLHDL 3.8 04/30/2016 0757   CHOLHDL 6.5 03/12/2016 1403   VLDL 64 (H) 03/12/2016 1403   LDLCALC 94 04/30/2016 0757      Wt Readings from Last 3 Encounters:  05/10/17 171 lb 8 oz (77.8 kg)  06/22/16 164 lb (74.4 kg)  04/02/16 160 lb (72.6 kg)      No flowsheet data found.    ASSESSMENT AND PLAN:  1. Coronary artery disease involving native coronary arteries without angina: He is doing extremely well with no anginal symptoms.  I discontinued Plavix today.  I also discontinued metoprolol given the lack of angina and concern about weight gain.    2. Hyperlipidemia: Previous LDL was 154.  I reviewed his most recent lipid profile in September which showed an LDL of 64 triglyceride was above 200.  Continue rosuvastatin 20 mg daily.  I elected to add Vascepa 2 g twice daily.  Repeat labs in 6 weeks.  3.  Overweight: I discussed with him the importance of getting back to his ideal weight he is hoping to do that with improved diet.  He already exercises on a regular basis.  Disposition:   FU with me in 12 months  Signed,  Lorine BearsMuhammad Roseanna Koplin, MD  05/10/2017 8:14 AM    [facility]

## 2017-06-21 ENCOUNTER — Other Ambulatory Visit
Admission: RE | Admit: 2017-06-21 | Discharge: 2017-06-21 | Disposition: A | Discharge status: Home / Self Care | Payer: 59 | Source: Ambulatory Visit | Attending: Cardiovascular Disease | Admitting: Cardiovascular Disease

## 2017-06-21 DIAGNOSIS — I2510 Atherosclerotic heart disease of native coronary artery without angina pectoris: Secondary | ICD-10-CM | POA: Insufficient documentation

## 2017-06-21 LAB — LIPID PANEL
CHOL/HDL RATIO: 3.7 ratio
Cholesterol: 125 mg/dL (ref 0–200)
HDL: 34 mg/dL — ABNORMAL LOW (ref >40)
LDL Cholesterol: 66 mg/dL (ref 0–99)
Triglycerides: 127 mg/dL (ref <150)
VLDL: 25 mg/dL (ref 0–40)

## 2017-06-21 LAB — HEPATIC FUNCTION PANEL
ALBUMIN: 4.1 g/dL (ref 3.5–5.0)
ALK PHOS: 68 U/L (ref 38–126)
ALT: 29 U/L (ref 17–63)
AST: 29 U/L (ref 15–41)
Bilirubin, Direct: 0.1 mg/dL (ref 0.1–0.5)
Indirect Bilirubin: 0.6 mg/dL (ref 0.3–0.9)
TOTAL PROTEIN: 7.5 g/dL (ref 6.5–8.1)
Total Bilirubin: 0.7 mg/dL (ref 0.3–1.2)

## 2017-07-16 ENCOUNTER — Telehealth: Payer: Self-pay | Admitting: *Deleted

## 2017-07-16 NOTE — Telephone Encounter (Signed)
Patient refuses lung screening scan at this time.

## 2017-09-10 ENCOUNTER — Telehealth: Payer: Self-pay | Admitting: Cardiovascular Disease

## 2017-09-10 NOTE — Telephone Encounter (Signed)
Patient on Aspirin. Routing to Dr Kirke Corin.

## 2017-09-10 NOTE — Telephone Encounter (Signed)
° °  [facility] Pre-operative Risk Assessment    Request for surgical clearance:  1. What type of surgery is being performed? Colonscopy   2. When is this surgery scheduled? 11/20/17   3. What type of clearance is required (medical clearance vs. Pharmacy clearance to hold med vs. Both)? Medical   4. Are there any medications that need to be held prior to surgery and how long? Not listed  5. Practice name and name of physician performing surgery? [facility]   6. What is your office phone number [phone]    7.   What is your office fax number [phone]   8.   Anesthesia type (None, local, MAC, general) ? Not listed

## 2017-09-11 NOTE — Telephone Encounter (Signed)
Epic fax sent to [facility].

## 2017-09-11 NOTE — Telephone Encounter (Signed)
Low risk.  He should continue to take aspirin 81 mg daily.

## 2017-11-19 ENCOUNTER — Encounter: Payer: Self-pay | Admitting: *Deleted

## 2017-11-20 ENCOUNTER — Encounter: Payer: Self-pay | Admitting: Anesthesiology

## 2017-11-20 ENCOUNTER — Ambulatory Visit: Payer: 59 | Admitting: Certified Registered Nurse Anesthetist

## 2017-11-20 ENCOUNTER — Ambulatory Visit
Admission: RE | Admit: 2017-11-20 | Discharge: 2017-11-20 | Disposition: A | Discharge status: Home / Self Care | Payer: 59 | Source: Ambulatory Visit | Attending: Unknown Physician Specialty | Admitting: Unknown Physician Specialty

## 2017-11-20 ENCOUNTER — Encounter
Admission: RE | Disposition: A | Discharge status: Home / Self Care | Payer: Self-pay | Source: Ambulatory Visit | Attending: Unknown Physician Specialty

## 2017-11-20 DIAGNOSIS — I2510 Atherosclerotic heart disease of native coronary artery without angina pectoris: Secondary | ICD-10-CM | POA: Diagnosis not present

## 2017-11-20 DIAGNOSIS — Z7982 Long term (current) use of aspirin: Secondary | ICD-10-CM | POA: Insufficient documentation

## 2017-11-20 DIAGNOSIS — K640 First degree hemorrhoids: Secondary | ICD-10-CM | POA: Insufficient documentation

## 2017-11-20 DIAGNOSIS — I252 Old myocardial infarction: Secondary | ICD-10-CM | POA: Insufficient documentation

## 2017-11-20 DIAGNOSIS — E785 Hyperlipidemia, unspecified: Secondary | ICD-10-CM | POA: Insufficient documentation

## 2017-11-20 DIAGNOSIS — Z87891 Personal history of nicotine dependence: Secondary | ICD-10-CM | POA: Insufficient documentation

## 2017-11-20 DIAGNOSIS — Z1211 Encounter for screening for malignant neoplasm of colon: Secondary | ICD-10-CM | POA: Insufficient documentation

## 2017-11-20 DIAGNOSIS — J449 Chronic obstructive pulmonary disease, unspecified: Secondary | ICD-10-CM | POA: Insufficient documentation

## 2017-11-20 DIAGNOSIS — Z79899 Other long term (current) drug therapy: Secondary | ICD-10-CM | POA: Diagnosis not present

## 2017-11-20 DIAGNOSIS — I10 Essential (primary) hypertension: Secondary | ICD-10-CM | POA: Insufficient documentation

## 2017-11-20 DIAGNOSIS — K635 Polyp of colon: Secondary | ICD-10-CM | POA: Diagnosis not present

## 2017-11-20 HISTORY — DX: Other intervertebral disc degeneration, lumbar region without mention of lumbar back pain or lower extremity pain: M51.369

## 2017-11-20 HISTORY — DX: Other intervertebral disc degeneration, lumbar region: M51.36

## 2017-11-20 HISTORY — PX: COLONOSCOPY WITH PROPOFOL: SHX5780

## 2017-11-20 SURGERY — COLONOSCOPY WITH PROPOFOL
Anesthesia: General

## 2017-11-20 MED ORDER — LIDOCAINE HCL (PF) 2 % IJ SOLN
INTRAMUSCULAR | Status: AC
Start: 2017-11-20 — End: 2017-11-20
  Filled 2017-11-20: qty 10

## 2017-11-20 MED ORDER — PHENYLEPHRINE HCL 10 MG/ML IJ SOLN
INTRAMUSCULAR | Status: DC | PRN
  Administered 2017-11-20: 200 ug via INTRAVENOUS

## 2017-11-20 MED ORDER — PROPOFOL 500 MG/50ML IV EMUL
INTRAVENOUS | Status: AC
  Filled 2017-11-20: qty 50

## 2017-11-20 MED ORDER — LIDOCAINE HCL (PF) 1 % IJ SOLN
INTRAMUSCULAR | Status: AC
  Administered 2017-11-20: 0.3 mL via INTRADERMAL
  Filled 2017-11-20: qty 2

## 2017-11-20 MED ORDER — SODIUM CHLORIDE 0.9 % IV SOLN: INTRAVENOUS | Status: DC

## 2017-11-20 MED ORDER — LIDOCAINE HCL (CARDIAC) PF 100 MG/5ML IV SOSY
PREFILLED_SYRINGE | INTRAVENOUS | Status: DC | PRN
  Administered 2017-11-20: 50 mg via INTRAVENOUS

## 2017-11-20 MED ORDER — PROPOFOL 10 MG/ML IV BOLUS
INTRAVENOUS | Status: DC | PRN
  Administered 2017-11-20: 30 mg via INTRAVENOUS
  Administered 2017-11-20: 70 mg via INTRAVENOUS

## 2017-11-20 MED ORDER — SODIUM CHLORIDE 0.9 % IV SOLN
INTRAVENOUS | Status: DC
  Administered 2017-11-20: 1000 mL via INTRAVENOUS

## 2017-11-20 MED ORDER — PROPOFOL 500 MG/50ML IV EMUL
INTRAVENOUS | Status: DC | PRN
  Administered 2017-11-20: 150 ug/kg/min via INTRAVENOUS

## 2017-11-20 MED ORDER — LIDOCAINE HCL (PF) 1 % IJ SOLN
2.0000 mL | Freq: Once | INTRAMUSCULAR | Status: AC
  Administered 2017-11-20: 0.3 mL via INTRADERMAL

## 2017-11-20 NOTE — Anesthesia Post-op Follow-up Note (Signed)
Anesthesia QCDR form completed.        

## 2017-11-20 NOTE — Anesthesia Postprocedure Evaluation (Signed)
Anesthesia Post Note  Patient: Matthew Arias  Procedure(s) Performed: COLONOSCOPY WITH PROPOFOL (N/A )  Patient location during evaluation: Endoscopy Anesthesia Type: General Level of consciousness: awake and alert Pain management: pain level controlled Vital Signs Assessment: post-procedure vital signs reviewed and stable Respiratory status: spontaneous breathing, nonlabored ventilation, respiratory function stable and patient connected to nasal cannula oxygen Cardiovascular status: blood pressure returned to baseline and stable Postop Assessment: no apparent nausea or vomiting Anesthetic complications: no     Last Vitals:  Vitals:   11/20/17 1004 11/20/17 1014  BP: 120/71 (!) 101/58  Pulse: (!) 59 (!) 57  Resp: 13 (!) 21  Temp:    SpO2: 100% 100%    Last Pain:  Vitals:   11/20/17 1014  TempSrc:   PainSc: 0-No pain                 Cleda MccreedyJoseph K Piscitello

## 2017-11-20 NOTE — Op Note (Signed)
[facility] Gastroenterology Patient Name: Matthew Arias Procedure Date: 11/20/2017 9:20 AM MRN: [id number] Account #: [id number] Date of Birth: 11-Sep-1955 Admit Type: Outpatient Age: [age] [facility] 3 Gender: Male Note Status: Finalized Procedure:            Colonoscopy Indications:          Screening for colorectal malignant neoplasm Providers:            Scot Junobert T. Elliott, MD Referring MD:         Daniel NonesBert Klein, MD (Referring MD) Medicines:            Propofol per Anesthesia Complications:        No immediate complications. Procedure:            Pre-Anesthesia Assessment:                       - After reviewing the risks and benefits, the patient                        was deemed in satisfactory condition to undergo the                        procedure.                       After obtaining informed consent, the colonoscope was                        passed under direct vision. Throughout the procedure,                        the patient's blood pressure, pulse, and oxygen                        saturations were monitored continuously. The                        Colonoscope was introduced through the anus and                        advanced to the the cecum, identified by appendiceal                        orifice and ileocecal valve. The colonoscopy was                        performed without difficulty. The patient tolerated the                        procedure well. The quality of the bowel preparation                        was excellent. Findings:      Two sessile polyps were found in the recto-sigmoid colon. The polyps       were diminutive in size. These polyps were removed with a jumbo cold       forceps. Resection and retrieval were complete.      Internal hemorrhoids were found during endoscopy. The hemorrhoids were       small and Grade I (internal hemorrhoids that do not prolapse).      The  exam was otherwise without abnormality. Impression:            - Two diminutive polyps at the recto-sigmoid colon,                        removed with a jumbo cold forceps. Resected and                        retrieved.                       - Internal hemorrhoids.                       - The examination was otherwise normal. Recommendation:       - Await pathology results. Scot Jun, MD 11/20/2017 9:50:40 AM This report has been signed electronically. Number of Addenda: 0 Note Initiated On: 11/20/2017 9:20 AM Scope Withdrawal Time: 0 hours 12 minutes 8 seconds  Total Procedure Duration: 0 hours 17 minutes 2 seconds       [facility]

## 2017-11-20 NOTE — H&P (Signed)
Primary Care Physician:  [name], MD Primary Gastroenterologist:  Dr. Mechele Collin  Pre-Procedure History & Physical: HPI:  Matthew Arias is a 62 y.o. male is here for an colonoscopy.  Done for screening.   Past Medical History:  Diagnosis Date  . Coronary artery disease    Non-ST elevation myocardial infarction in November 2017. Cardiac catheterization showed occluded first diagonal with faint collaterals. EF was 50-55%. There was mild nonobstructive disease otherwise. He was treated medically.  . Degenerative disc disease, lumbar   . Hyperlipidemia   . Lumbar degenerative disc disease   . Pulmonary nodules     Past Surgical History:  Procedure Laterality Date  . CARDIAC CATHETERIZATION N/A 03/12/2016   Procedure: Left Heart Cath and Coronary Angiography;  Surgeon: Iran Ouch, MD;  Location: [facility] INVASIVE CV LAB;  Service: Cardiovascular;  Laterality: N/A;  . CARPAL TUNNEL RELEASE    . COLONOSCOPY    . LUMBAR LAMINECTOMY    . TONSILLECTOMY      Prior to Admission medications   Medication Sig Start Date End Date Taking? Authorizing Provider  aspirin EC 81 MG EC tablet Take 1 tablet (81 mg total) by mouth daily. 03/13/16   Milagros Loll, MD  Icosapent Ethyl (VASCEPA) 1 g CAPS Take 2 tablets by mouth 2 (two) times daily with a meal. 05/10/17   Iran Ouch, MD  nitroGLYCERIN (NITROSTAT) 0.4 MG SL tablet Place 1 tablet (0.4 mg total) under the tongue every 5 (five) minutes as needed for chest pain. 03/13/16   Sudini, Wardell Heath, MD  rosuvastatin (CRESTOR) 20 MG tablet TAKE 1 TABLET (20 MG TOTAL) BY MOUTH DAILY. 04/26/17 07/25/17  Iran Ouch, MD    Allergies as of 09/23/2017 - Review Complete 05/10/2017  Allergen Reaction Noted  . Atorvastatin Other (See Comments) 03/21/2016  . Codeine Nausea Only 10/11/2014    Family History  Problem Relation Age of Onset  . Arthritis Mother   . CAD Father     Social History   Socioeconomic History  . Marital status:  Married    Spouse name: Not on file  . Number of children: Not on file  . Years of education: Not on file  . Highest education level: Not on file  Occupational History  . Not on file  Social Needs  . Financial resource strain: Not on file  . Food insecurity:    Worry: Not on file    Inability: Not on file  . Transportation needs:    Medical: Not on file    Non-medical: Not on file  Tobacco Use  . Smoking status: Former Smoker    Packs/day: 1.00    Years: 45.00    Pack years: 45.00    Types: Cigarettes    Last attempt to quit: 04/24/2015    Years since quitting: 2.5  . Smokeless tobacco: Never Used  . Tobacco comment: used to smoke 1PPD for 40 years  Substance and Sexual Activity  . Alcohol use: Yes    Comment: occasional  . Drug use: No  . Sexual activity: Not on file    Comment: Married   Lifestyle  . Physical activity:    Days per week: Not on file    Minutes per session: Not on file  . Stress: Not on file  Relationships  . Social connections:    Talks on phone: Not on file    Gets together: Not on file    Attends religious service: Not on file  Active member of club or organization: Not on file    Attends meetings of clubs or organizations: Not on file    Relationship status: Not on file  . Intimate partner violence:    Fear of current or ex partner: Not on file    Emotionally abused: Not on file    Physically abused: Not on file    Forced sexual activity: Not on file  Other Topics Concern  . Not on file  Social History Narrative   Independent. Lives at home with family. Very active at baseline    Review of Systems: See HPI, otherwise negative ROS  Physical Exam: BP 116/70   Pulse 65   Temp (!) 96.4 F (35.8 C) (Tympanic)   Resp 17   Ht 5\' 4"  (1.626 m)   Wt 68.9 kg (152 lb)   SpO2 99%   BMI 26.09 kg/m  General:   Alert,  pleasant and cooperative in NAD Head:  Normocephalic and atraumatic. Neck:  Supple; no masses or thyromegaly. Lungs:  Clear  throughout to auscultation.    Heart:  Regular rate and rhythm. Abdomen:  Soft, nontender and nondistended. Normal bowel sounds, without guarding, and without rebound.   Neurologic:  Alert and  oriented x4;  grossly normal neurologically.  Impression/Plan: Matthew Arias is here for an colonoscopy to be performed for screening colonoscopy  Risks, benefits, limitations, and alternatives regarding  colonoscopy have been reviewed with the patient.  Questions have been answered.  All parties agreeable.   Lynnae PrudeELLIOTT, Naja Apperson, MD  11/20/2017, 9:20 AM

## 2017-11-20 NOTE — Anesthesia Preprocedure Evaluation (Signed)
Anesthesia Evaluation  Patient identified by MRN, date of birth, ID band Patient awake    Reviewed: Allergy & Precautions, H&P , NPO status , Patient's Chart, lab work & pertinent test results  History of Anesthesia Complications Negative for: history of anesthetic complications  Airway Mallampati: III  TM Distance: <3 FB Neck ROM: limited    Dental  (+) Chipped, Poor Dentition   Pulmonary neg shortness of breath, COPD, former smoker,           Cardiovascular Exercise Tolerance: Good hypertension, (-) angina+ CAD and + Past MI  (-) DOE      Neuro/Psych negative neurological ROS  negative psych ROS   GI/Hepatic negative GI ROS, Neg liver ROS, neg GERD  ,  Endo/Other  negative endocrine ROS  Renal/GU negative Renal ROS  negative genitourinary   Musculoskeletal   Abdominal   Peds  Hematology negative hematology ROS (+)   Anesthesia Other Findings Past Medical History: No date: Coronary artery disease     Comment:  Non-ST elevation myocardial infarction in November 2017.              Cardiac catheterization showed occluded first diagonal               with faint collaterals. EF was 50-55%. There was mild               nonobstructive disease otherwise. He was treated               medically. No date: Degenerative disc disease, lumbar No date: Hyperlipidemia No date: Lumbar degenerative disc disease No date: Pulmonary nodules  Past Surgical History: 03/12/2016: CARDIAC CATHETERIZATION; N/A     Comment:  Procedure: Left Heart Cath and Coronary Angiography;                Surgeon: Iran OuchMuhammad A Arida, MD;  Location: [facility] INVASIVE               CV LAB;  Service: Cardiovascular;  Laterality: N/A; No date: CARPAL TUNNEL RELEASE No date: COLONOSCOPY No date: LUMBAR LAMINECTOMY No date: TONSILLECTOMY  BMI    Body Mass Index:  26.09 kg/m      Reproductive/Obstetrics negative OB ROS                             Anesthesia Physical Anesthesia Plan  ASA: III  Anesthesia Plan: General   Post-op Pain Management:    Induction: Intravenous  PONV Risk Score and Plan: Propofol infusion and TIVA  Airway Management Planned: Natural Airway and Nasal Cannula  Additional Equipment:   Intra-op Plan:   Post-operative Plan:   Informed Consent: I have reviewed the patients History and Physical, chart, labs and discussed the procedure including the risks, benefits and alternatives for the proposed anesthesia with the patient or authorized representative who has indicated his/her understanding and acceptance.   Dental Advisory Given  Plan Discussed with: Anesthesiologist, CRNA and Surgeon  Anesthesia Plan Comments: (Patient consented for risks of anesthesia including but not limited to:  - adverse reactions to medications - risk of intubation if required - damage to teeth, lips or other oral mucosa - sore throat or hoarseness - Damage to heart, brain, lungs or loss of life  Patient voiced understanding.)        Anesthesia Quick Evaluation

## 2017-11-20 NOTE — Anesthesia Procedure Notes (Signed)
Date/Time: 11/20/2017 9:24 AM Performed by: Ginger CarneMichelet, Donnalyn Juran, CRNA Pre-anesthesia Checklist: Patient identified, Emergency Drugs available, Suction available, Patient being monitored and Timeout performed Patient Re-evaluated:Patient Re-evaluated prior to induction Oxygen Delivery Method: Nasal cannula Preoxygenation: Pre-oxygenation with 100% oxygen

## 2017-11-20 NOTE — Transfer of Care (Signed)
Immediate Anesthesia Transfer of Care Note  Patient: Matthew Arias  Procedure(s) Performed: COLONOSCOPY WITH PROPOFOL (N/A )  Patient Location: PACU  Anesthesia Type:General  Level of Consciousness: awake, alert  and oriented  Airway & Oxygen Therapy: Patient Spontanous Breathing and Patient connected to nasal cannula oxygen  Post-op Assessment: Report given to RN and Post -op Vital signs reviewed and stable  Post vital signs: Reviewed and stable  Last Vitals:  Vitals Value Taken Time  BP 120/71 11/20/2017  9:57 AM  Temp 36.1 C 11/20/2017  9:54 AM  Pulse 53 11/20/2017  9:58 AM  Resp 14 11/20/2017  9:58 AM  SpO2 99 % 11/20/2017  9:58 AM  Vitals shown include unvalidated device data.  Last Pain:  Vitals:   11/20/17 0954  TempSrc: Tympanic  PainSc: 0-No pain         Complications: No apparent anesthesia complications

## 2017-11-21 ENCOUNTER — Encounter: Payer: Self-pay | Admitting: Unknown Physician Specialty

## 2017-11-23 LAB — SURGICAL PATHOLOGY

## 2017-12-24 ENCOUNTER — Other Ambulatory Visit: Payer: Self-pay | Admitting: Cardiovascular Disease

## 2017-12-24 NOTE — Telephone Encounter (Signed)
Please review refill for Metoprolol, per Dr. Jari Sportsman last office visit, the metoprolol was decreased to 1/2 tablet twice daily for 2 days then STOP taking metoprolol.

## 2018-01-22 ENCOUNTER — Other Ambulatory Visit: Payer: Self-pay | Admitting: Internal Medicine

## 2018-01-22 DIAGNOSIS — M5136 Other intervertebral disc degeneration, lumbar region: Secondary | ICD-10-CM

## 2018-02-06 ENCOUNTER — Ambulatory Visit
Admission: RE | Admit: 2018-02-06 | Discharge: 2018-02-06 | Disposition: A | Discharge status: Home / Self Care | Payer: 59 | Source: Ambulatory Visit | Attending: Internal Medicine | Admitting: Internal Medicine

## 2018-02-06 DIAGNOSIS — M5126 Other intervertebral disc displacement, lumbar region: Secondary | ICD-10-CM | POA: Diagnosis not present

## 2018-02-06 DIAGNOSIS — M5136 Other intervertebral disc degeneration, lumbar region: Secondary | ICD-10-CM | POA: Diagnosis present

## 2018-02-06 DIAGNOSIS — M48061 Spinal stenosis, lumbar region without neurogenic claudication: Secondary | ICD-10-CM | POA: Insufficient documentation

## 2018-03-06 IMAGING — CT CT CHEST W/O CM
1 series · 15 of 34 positions shown, 19 images · non-contrast
Comparison: Chest CT 07/06/2013 and earlier.

CLINICAL DATA: 59-year-old male with chronic pulmonary nodules. No
history of malignancy. Former smoker. Recent myocardial infarction.
Subsequent encounter.

EXAM:
CT CHEST WITHOUT CONTRAST
TECHNIQUE: Multidetector CT imaging of the chest was performed following the
standard protocol without IV contrast.

[Series 2: thorax · axial · 0.72mm/px · z∈[-721,-441]mm · 15 of 166 slices shown, 19 images]
[im 13/166  mediastinal]
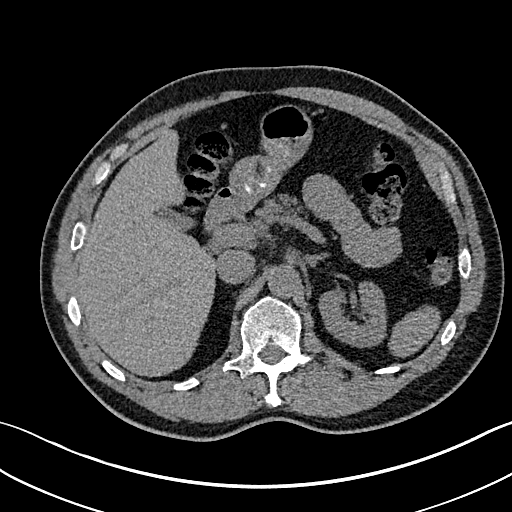
[im 13/166  lung]
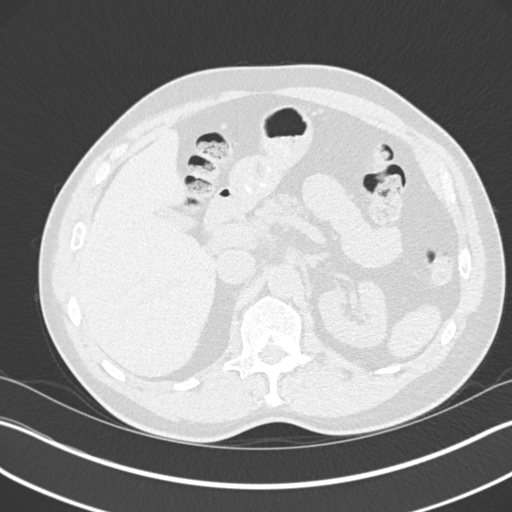
[im 25/166  lung]
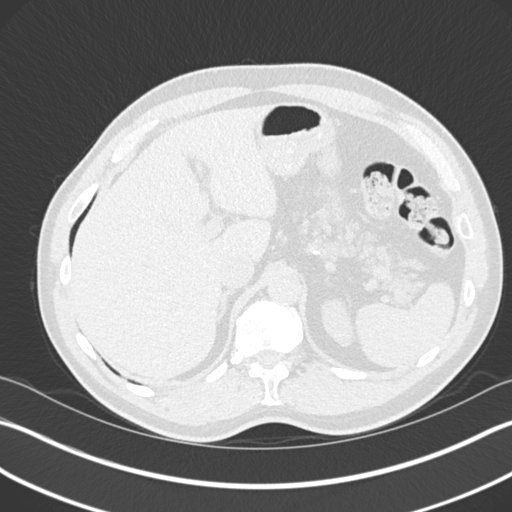
[im 34/166  lung]
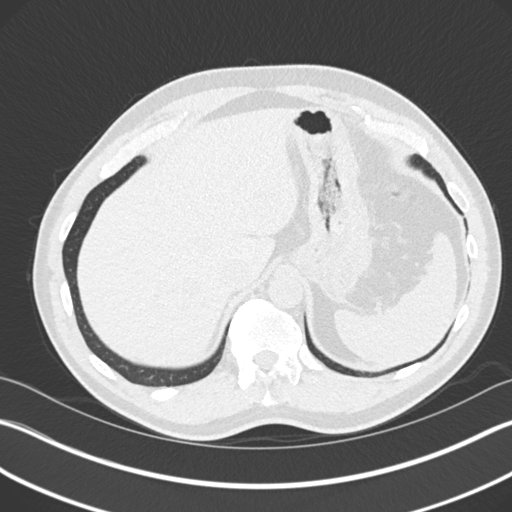
[im 43/166  lung]
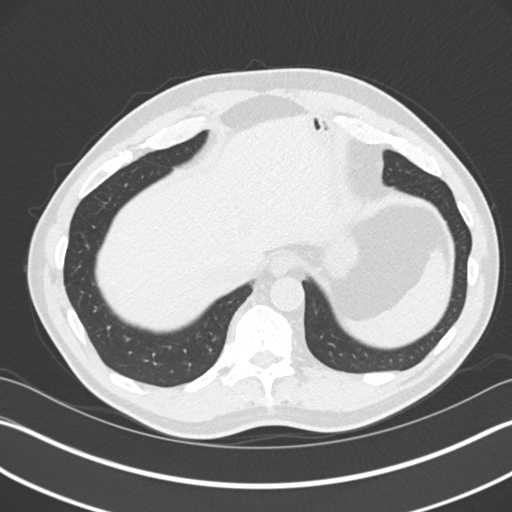
[im 56/166  mediastinal]
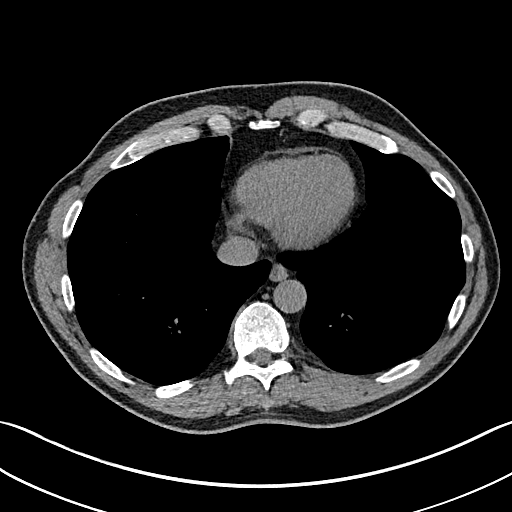
[im 56/166  lung]
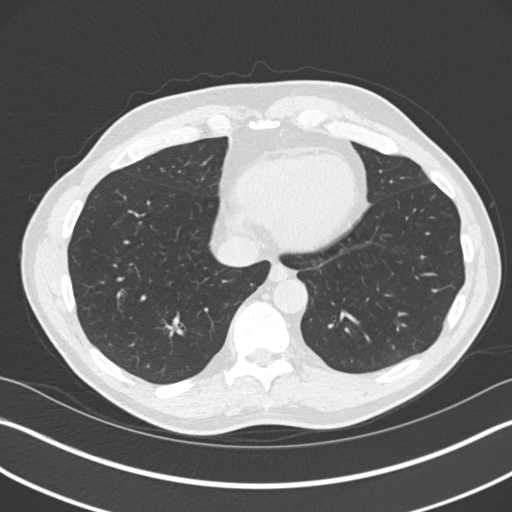
[im 67/166  lung]
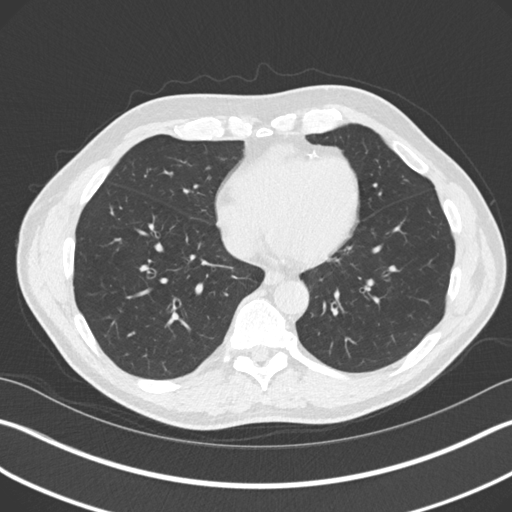
[im 74/166  lung]
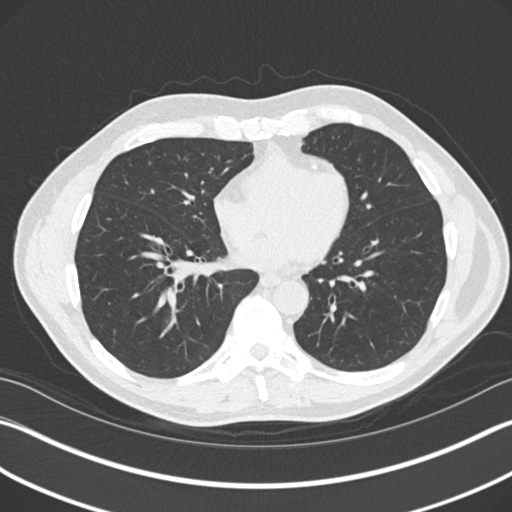
[im 86/166  lung]
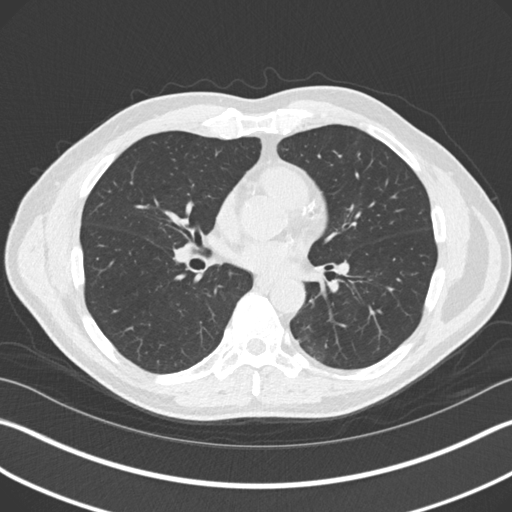
[im 92/166  mediastinal]
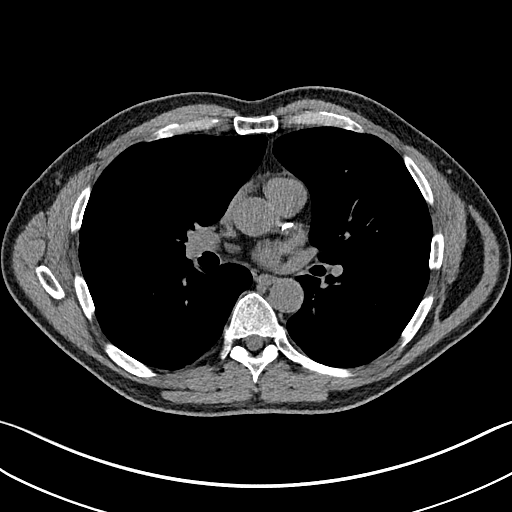
[im 92/166  lung]
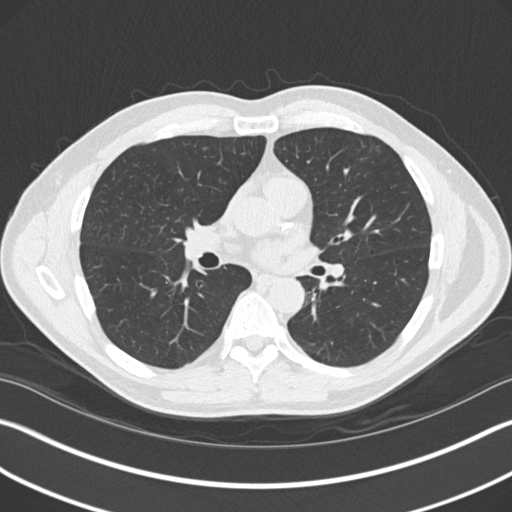
[im 100/166  lung]
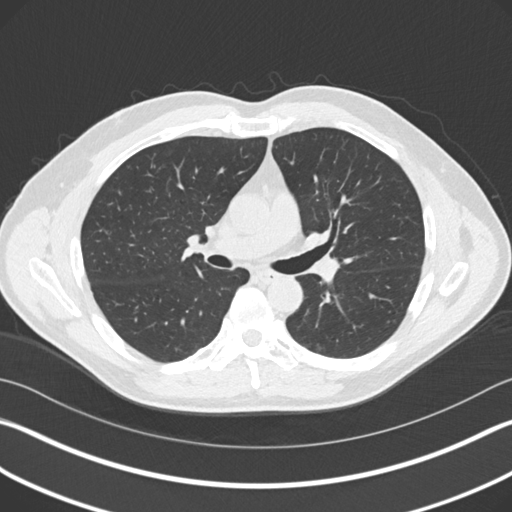
[im 111/166  lung]
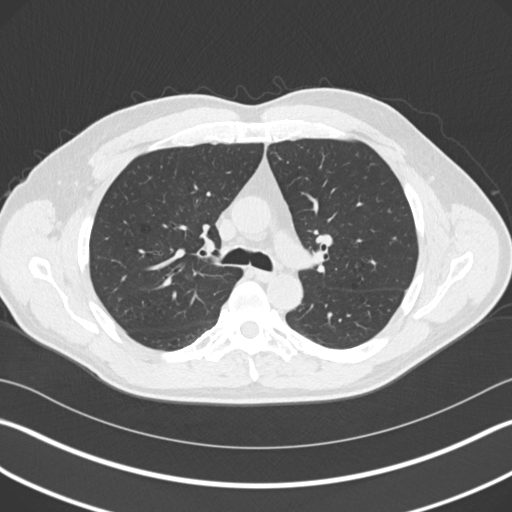
[im 123/166  lung]
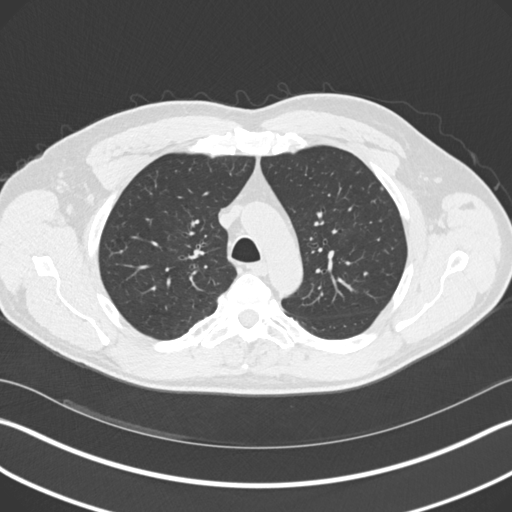
[im 133/166  mediastinal]
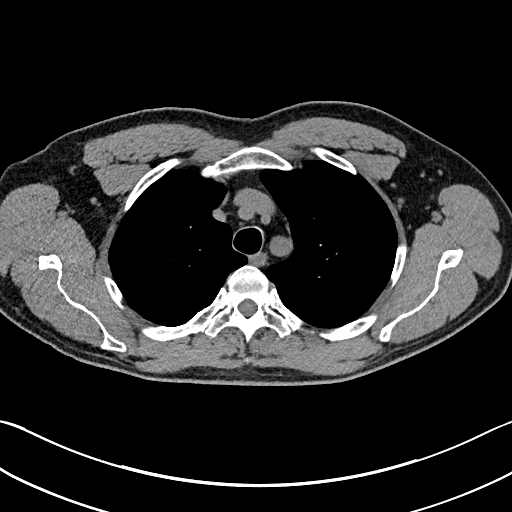
[im 133/166  lung]
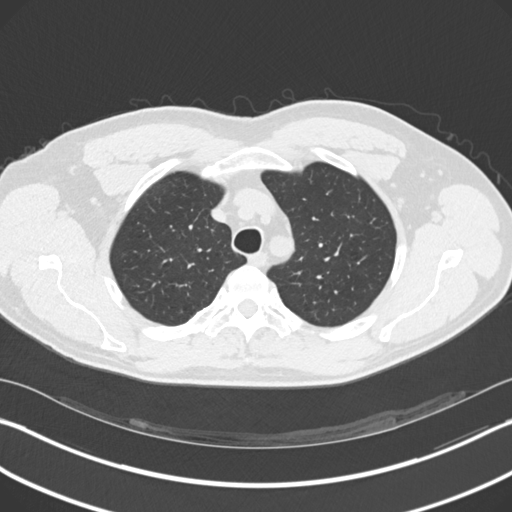
[im 141/166  lung]
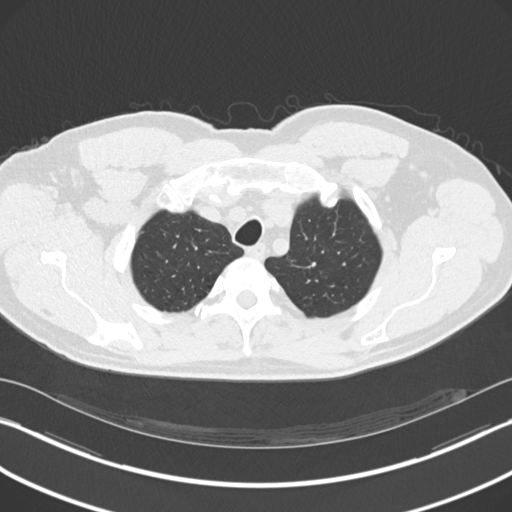
[im 153/166  lung]
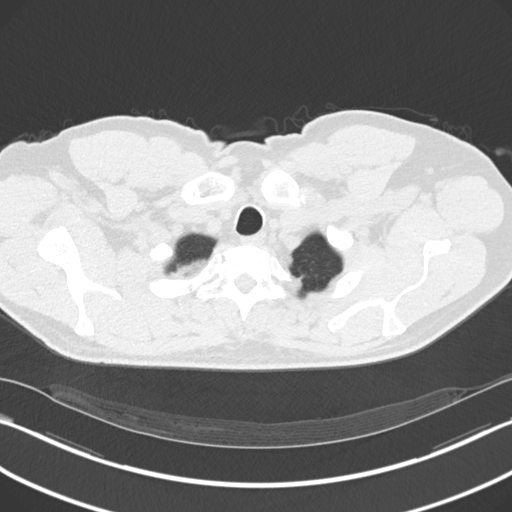

[15 of 34 positions shown; findings below may reference images not displayed]

FINDINGS: Cardiovascular: Calcified coronary artery atherosclerosis. No
significant calcified thoracic aortic atherosclerosis. No
cardiomegaly. No pericardial effusion.

Mediastinum/Nodes: Negative.  No lymphadenopathy.

Lungs/Pleura: Major airways are patent.

Mild upper lobe centrilobular emphysema. Borderline to mild
bilateral perihilar and lower lobe bronchiectasis (series 3, image
103). Small calcified granuloma along the right major fissure on
series 3, image 95, is unchanged in corresponds to the right lung
nodules described previously.

Mild subpleural scarring in the medial aspect of the superior
segment of the left lower lobe is stable (series 3, image 80).
Stable left apical lung nodule measuring 4-5 mm on series 3, image
16. Stable subpleural sub solid left upper lobe nodule on image 44.

No pleural effusion.

Upper Abdomen: Negative noncontrast visualized liver, gallbladder,
spleen, pancreas, adrenal glands, kidneys, and bowel in the upper
abdomen.

Musculoskeletal: Stable visualized osseous structures.
IMPRESSION: 1. Several left lung pulmonary nodules are stable since 7108 and
benign, as is a small right lung calcified granuloma. No new
pulmonary abnormality.
2. Mild emphysema and bronchiectasis.
3. Calcified coronary artery atherosclerosis.

## 2018-04-27 ENCOUNTER — Other Ambulatory Visit: Payer: Self-pay | Admitting: Cardiovascular Disease

## 2018-05-02 ENCOUNTER — Encounter: Payer: Self-pay | Admitting: Cardiovascular Disease

## 2018-05-02 ENCOUNTER — Ambulatory Visit (INDEPENDENT_AMBULATORY_CARE_PROVIDER_SITE_OTHER): Payer: 59 | Admitting: Cardiovascular Disease

## 2018-05-02 VITALS — BP 140/80 | Ht 64.0 in | Wt 165.0 lb

## 2018-05-02 DIAGNOSIS — E782 Mixed hyperlipidemia: Secondary | ICD-10-CM | POA: Diagnosis not present

## 2018-05-02 DIAGNOSIS — I2510 Atherosclerotic heart disease of native coronary artery without angina pectoris: Secondary | ICD-10-CM

## 2018-05-02 NOTE — Patient Instructions (Signed)
Medication Instructions:  No changes  If you need a refill on your cardiac medications before your next appointment, please call your pharmacy.   Lab work: None ordered  Testing/Procedures: None ordered  Follow-Up: At [facility], you and your health needs are our priority.  As part of our continuing mission to provide you with exceptional heart care, we have created designated Provider Care Teams.  These Care Teams include your primary Cardiologist (physician) and Advanced Practice Providers (APPs -  Physician Assistants and Nurse Practitioners) who all work together to provide you with the care you need, when you need it. You will need a follow up appointment in 12 months.  Please call our office 2 months in advance to schedule this appointment.  You may see Dr. Arida or one of the following Advanced Practice Providers on your designated Care Team:   Christopher Berge, NP Ryan Dunn, PA-C . Jacquelyn Visser, PA-C    

## 2018-05-02 NOTE — Progress Notes (Signed)
Cardiology Office Note   Date:  05/02/2018   ID:  Matthew Arias, DOB 15-Oct-1955, MRN [id number]  PCP:  [name], MD  Cardiologist:   [name], MD   Chief Complaint  Patient presents with  . other    12 mo follow up. Medications reviewed verbally.       History of Present Illness: Matthew Arias is a 63 y.o. male who presents for a follow-up visit regarding coronary artery disease. He has overall been healthy throughout his life and very active. He presented in 02/2016 with NSTEMI.  Cardiac catheterization  showed an occluded first diagonal with mild disease elsewhere. There was faint collaterals and the vessel was overall 2 mm in diameter with ostial location. I elected to treat him medically. Ejection fraction was 50-55%.   He has known history of mixed hyperlipidemia and most recently,Vascepa was added for elevated triglyceride.  He has been doing well overall with no chest pain, shortness of breath or palpitations.  He continues to exercise regularly.  Past Medical History:  Diagnosis Date  . Coronary artery disease    Non-ST elevation myocardial infarction in November 2017. Cardiac catheterization showed occluded first diagonal with faint collaterals. EF was 50-55%. There was mild nonobstructive disease otherwise. He was treated medically.  . Degenerative disc disease, lumbar   . Hyperlipidemia   . Lumbar degenerative disc disease   . Pulmonary nodules     Past Surgical History:  Procedure Laterality Date  . CARDIAC CATHETERIZATION N/A 03/12/2016   Procedure: Left Heart Cath and Coronary Angiography;  Surgeon: Iran Ouch, MD;  Location: [facility] INVASIVE CV LAB;  Service: Cardiovascular;  Laterality: N/A;  . CARPAL TUNNEL RELEASE    . COLONOSCOPY    . COLONOSCOPY WITH PROPOFOL N/A 11/20/2017   Procedure: COLONOSCOPY WITH PROPOFOL;  Surgeon: Scot Jun, MD;  Location: [facility] ENDOSCOPY;  Service: Endoscopy;  Laterality: N/A;  . LUMBAR LAMINECTOMY    .  TONSILLECTOMY       Current Outpatient Medications  Medication Sig Dispense Refill  . aspirin EC 81 MG EC tablet Take 1 tablet (81 mg total) by mouth daily.    . nitroGLYCERIN (NITROSTAT) 0.4 MG SL tablet Place 1 tablet (0.4 mg total) under the tongue every 5 (five) minutes as needed for chest pain. 20 tablet 0  . rosuvastatin (CRESTOR) 20 MG tablet TAKE 1 TABLET (20 MG TOTAL) BY MOUTH DAILY. 90 tablet 0  . VASCEPA 1 g CAPS TAKE 2 TABLETS BY MOUTH 2 (TWO) TIMES DAILY WITH A MEAL. 120 capsule 0   No current facility-administered medications for this visit.     Allergies:   Atorvastatin and Codeine    Social History:  The patient  reports that he quit smoking about 3 years ago. His smoking use included cigarettes. He has a 45.00 pack-year smoking history. He has never used smokeless tobacco. He reports current alcohol use. He reports that he does not use drugs.   Family History:  The patient's family history includes Arthritis in his mother; CAD in his father.    ROS:  Please see the history of present illness.   Otherwise, review of systems are positive for none.   All other systems are reviewed and negative.    PHYSICAL EXAM: VS:  BP 140/80 (BP Location: Left Arm, Patient Position: Sitting, Cuff Size: Normal)   Ht 5\' 4"  (1.626 m)   Wt 165 lb (74.8 kg)   BMI 28.32 kg/m  ,  BMI Body mass index is 28.32 kg/m. GEN: Well nourished, well developed, in no acute distress  HEENT: normal  Neck: no JVD, carotid bruits, or masses Cardiac: RRR; no murmurs, rubs, or gallops,no edema  Respiratory:  clear to auscultation bilaterally, normal work of breathing GI: soft, nontender, nondistended, + BS MS: no deformity or atrophy  Skin: warm and dry, no rash Neuro:  Strength and sensation are intact Psych: euthymic mood, full affect   EKG:  EKG is  ordered today. EKG showed normal sinus rhythm with no significant ST or T wave changes.   Recent Labs: 06/21/2017: ALT 29    Lipid Panel      Component Value Date/Time   CHOL 125 06/21/2017 0757   CHOL 157 04/30/2016 0757   TRIG 127 06/21/2017 0757   HDL 34 (L) 06/21/2017 0757   HDL 41 04/30/2016 0757   CHOLHDL 3.7 06/21/2017 0757   VLDL 25 06/21/2017 0757   LDLCALC 66 06/21/2017 0757   LDLCALC 94 04/30/2016 0757      Wt Readings from Last 3 Encounters:  05/02/18 165 lb (74.8 kg)  11/20/17 152 lb (68.9 kg)  05/10/17 171 lb 8 oz (77.8 kg)      No flowsheet data found.    ASSESSMENT AND PLAN:  1. Coronary artery disease involving native coronary arteries without angina: He is doing extremely well with no anginal symptoms.   2. Hyperlipidemia: Well-controlled with rosuvastatin and Vascepa.  Most recent lipid profile showed a triglyceride of 127 and an LDL of 66.  Both of these were at target.  His co-pay for Vascepa has increased this year but we will provide him with another coupon.   Disposition:   FU with me in 12 months  Signed,  Lorine BearsMuhammad Elna Radovich, MD  05/02/2018 4:35 PM    [facility]

## 2018-05-07 ENCOUNTER — Other Ambulatory Visit: Payer: Self-pay

## 2018-05-07 MED ORDER — ICOSAPENT ETHYL 1 G PO CAPS: ORAL_CAPSULE | ORAL | 11 refills | Status: DC

## 2018-05-07 MED ORDER — NITROGLYCERIN 0.4 MG SL SUBL: 0.4000 mg | SUBLINGUAL_TABLET | SUBLINGUAL | 1 refills | Status: DC | PRN

## 2018-05-07 MED ORDER — ROSUVASTATIN CALCIUM 20 MG PO TABS: 20.0000 mg | ORAL_TABLET | Freq: Every day | ORAL | 3 refills | Status: DC

## 2018-05-07 NOTE — Telephone Encounter (Signed)
*  STAT* If patient is at the pharmacy, call can be transferred to refill team.   1. Which medications need to be refilled? (please list name of each medication and dose if known) Crestor, Nitroglycerin, Vascepa  2. Which pharmacy/location (including street and city if local pharmacy) is medication to be sent to? [facility] [place]  3. Do they need a 30 day or 90 day supply? 1 year

## 2018-05-08 ENCOUNTER — Ambulatory Visit: Payer: 59 | Admitting: Cardiovascular Disease

## 2019-02-10 ENCOUNTER — Other Ambulatory Visit: Payer: Self-pay | Admitting: *Deleted

## 2019-02-10 DIAGNOSIS — Z20822 Contact with and (suspected) exposure to covid-19: Secondary | ICD-10-CM

## 2019-02-11 LAB — NOVEL CORONAVIRUS, NAA: SARS-CoV-2, NAA: NOT DETECTED

## 2019-02-27 ENCOUNTER — Emergency Department
Admission: EM | Admit: 2019-02-27 | Discharge: 2019-02-27 | Disposition: A | Discharge status: Home / Self Care | Payer: 59 | Attending: Emergency Medicine | Admitting: Emergency Medicine

## 2019-02-27 ENCOUNTER — Other Ambulatory Visit: Payer: Self-pay

## 2019-02-27 ENCOUNTER — Encounter: Payer: Self-pay | Admitting: Emergency Medicine

## 2019-02-27 DIAGNOSIS — R112 Nausea with vomiting, unspecified: Secondary | ICD-10-CM | POA: Diagnosis not present

## 2019-02-27 DIAGNOSIS — Z79899 Other long term (current) drug therapy: Secondary | ICD-10-CM | POA: Diagnosis not present

## 2019-02-27 DIAGNOSIS — Z87891 Personal history of nicotine dependence: Secondary | ICD-10-CM | POA: Insufficient documentation

## 2019-02-27 DIAGNOSIS — Z7982 Long term (current) use of aspirin: Secondary | ICD-10-CM | POA: Diagnosis not present

## 2019-02-27 DIAGNOSIS — I2510 Atherosclerotic heart disease of native coronary artery without angina pectoris: Secondary | ICD-10-CM | POA: Diagnosis not present

## 2019-02-27 DIAGNOSIS — I252 Old myocardial infarction: Secondary | ICD-10-CM | POA: Insufficient documentation

## 2019-02-27 DIAGNOSIS — R42 Dizziness and giddiness: Secondary | ICD-10-CM | POA: Diagnosis present

## 2019-02-27 LAB — URINALYSIS, COMPLETE (UACMP) WITH MICROSCOPIC
Bilirubin Urine: NEGATIVE
Glucose, UA: NEGATIVE mg/dL
Hgb urine dipstick: NEGATIVE
Ketones, ur: NEGATIVE mg/dL
Leukocytes,Ua: NEGATIVE
Nitrite: NEGATIVE
Protein, ur: 30 mg/dL — AB
Specific Gravity, Urine: 1.017 (ref 1.005–1.030)
Squamous Epithelial / LPF: NONE SEEN (ref 0–5)
pH: 7 (ref 5.0–8.0)

## 2019-02-27 LAB — CBC
HCT: 42.5 % (ref 39.0–52.0)
Hemoglobin: 14.8 g/dL (ref 13.0–17.0)
MCH: 28.6 pg (ref 26.0–34.0)
MCHC: 34.8 g/dL (ref 30.0–36.0)
MCV: 82 fL (ref 80.0–100.0)
Platelets: 360 10*3/uL (ref 150–400)
RBC: 5.18 MIL/uL (ref 4.22–5.81)
RDW: 12.3 % (ref 11.5–15.5)
WBC: 11 10*3/uL — ABNORMAL HIGH (ref 4.0–10.5)
nRBC: 0 % (ref 0.0–0.2)

## 2019-02-27 LAB — COMPREHENSIVE METABOLIC PANEL
ALT: 42 U/L (ref 0–44)
AST: 22 U/L (ref 15–41)
Albumin: 4.1 g/dL (ref 3.5–5.0)
Alkaline Phosphatase: 71 U/L (ref 38–126)
Anion gap: 10 (ref 5–15)
BUN: 17 mg/dL (ref 8–23)
CO2: 26 mmol/L (ref 22–32)
Calcium: 9.1 mg/dL (ref 8.9–10.3)
Chloride: 102 mmol/L (ref 98–111)
Creatinine, Ser: 0.88 mg/dL (ref 0.61–1.24)
GFR calc Af Amer: >60 mL/min (ref >60)
GFR calc non Af Amer: >60 mL/min (ref >60)
Glucose, Bld: 127 mg/dL — ABNORMAL HIGH (ref 70–99)
Potassium: 3.9 mmol/L (ref 3.5–5.1)
Sodium: 138 mmol/L (ref 135–145)
Total Bilirubin: 1.2 mg/dL (ref 0.3–1.2)
Total Protein: 7.3 g/dL (ref 6.5–8.1)

## 2019-02-27 LAB — TROPONIN I (HIGH SENSITIVITY): Troponin I (High Sensitivity): 4 ng/L (ref <18)

## 2019-02-27 LAB — LIPASE, BLOOD: Lipase: 39 U/L (ref 11–51)

## 2019-02-27 MED ORDER — MECLIZINE HCL 25 MG PO TABS: 25.0000 mg | ORAL_TABLET | Freq: Three times a day (TID) | ORAL | 0 refills | Status: AC | PRN

## 2019-02-27 MED ORDER — ONDANSETRON 4 MG PO TBDP: 4.0000 mg | ORAL_TABLET | Freq: Three times a day (TID) | ORAL | 0 refills | Status: AC | PRN

## 2019-02-27 MED ORDER — ONDANSETRON 4 MG PO TBDP
4.0000 mg | ORAL_TABLET | Freq: Once | ORAL | Status: AC | PRN
  Administered 2019-02-27: 19:00:00 4 mg via ORAL
  Filled 2019-02-27: qty 1

## 2019-02-27 MED ORDER — SODIUM CHLORIDE 0.9% FLUSH: 3.0000 mL | Freq: Once | INTRAVENOUS | Status: DC

## 2019-02-27 NOTE — ED Notes (Signed)
Pt given graham crackers and ginger ale by EDP Malinda.

## 2019-02-27 NOTE — ED Provider Notes (Signed)
[facility] Emergency Department Provider Note   ____________________________________________   First MD Initiated Contact with Patient 02/27/19 1836     (approximate)  I have reviewed the triage vital signs and the nursing notes.   HISTORY  Chief Complaint Emesis    HPI Matthew Arias is a 63 y.o. male who reports that this morning he had his coffee and then got vertigo spinning and then vomiting.  Every time he moved to try to stand up he did have return of spinning sensation nausea and vomiting.  No headache no other complaints.  Lasted until about 3 or 4:00 and then went away.  He reports he just recovered from Covid.  Prior history includes MRI.  He did not have any chest pain or shortness of breath with any of this.   Symptoms are completely resolved at present.      Past Medical History:  Diagnosis Date   Coronary artery disease    Non-ST elevation myocardial infarction in November 2017. Cardiac catheterization showed occluded first diagonal with faint collaterals. EF was 50-55%. There was mild nonobstructive disease otherwise. He was treated medically.   Degenerative disc disease, lumbar    Hyperlipidemia    Lumbar degenerative disc disease    Pulmonary nodules     Patient Active Problem List   Diagnosis Date Noted   Coronary artery disease    Elevated troponin    Unstable angina (HCC)    NSTEMI (non-ST elevated myocardial infarction) (HCC) 03/12/2016   Accelerated hypertension 03/12/2016    Past Surgical History:  Procedure Laterality Date   CARDIAC CATHETERIZATION N/A 03/12/2016   Procedure: Left Heart Cath and Coronary Angiography;  Surgeon: Iran OuchMuhammad A Arida, MD;  Location: [facility] INVASIVE CV LAB;  Service: Cardiovascular;  Laterality: N/A;   CARPAL TUNNEL RELEASE     COLONOSCOPY     COLONOSCOPY WITH PROPOFOL N/A 11/20/2017   Procedure: COLONOSCOPY WITH PROPOFOL;  Surgeon: Scot JunElliott, Robert T, MD;  [facility] ENDOSCOPY;   Service: Endoscopy;  Laterality: N/A;   LUMBAR LAMINECTOMY     TONSILLECTOMY      Prior to Admission medications   Medication Sig Start Date End Date Taking? Authorizing Provider  aspirin EC 81 MG EC tablet Take 1 tablet (81 mg total) by mouth daily. 03/13/16   Milagros LollSudini, Srikar, MD  Icosapent Ethyl (VASCEPA) 1 g CAPS TAKE 2 TABLETS BY MOUTH 2 (TWO) TIMES DAILY WITH A MEAL. 05/07/18   Iran OuchArida, Muhammad A, MD  meclizine (ANTIVERT) 25 MG tablet Take 1 tablet (25 mg total) by mouth 3 (three) times daily as needed for dizziness. 02/27/19   Arnaldo NatalMalinda, Jan Walters F, MD  nitroGLYCERIN (NITROSTAT) 0.4 MG SL tablet Place 1 tablet (0.4 mg total) under the tongue every 5 (five) minutes as needed for chest pain. 05/07/18   Iran OuchArida, Muhammad A, MD  ondansetron (ZOFRAN ODT) 4 MG disintegrating tablet Take 1 tablet (4 mg total) by mouth every 8 (eight) hours as needed for nausea or vomiting. 02/27/19   Arnaldo NatalMalinda, Nyree Applegate F, MD  rosuvastatin (CRESTOR) 20 MG tablet Take 1 tablet (20 mg total) by mouth daily. 05/07/18 08/05/18  Iran OuchArida, Muhammad A, MD    Allergies Atorvastatin and Codeine  Family History  Problem Relation Age of Onset   Arthritis Mother    CAD Father     Social History Social History   Tobacco Use   Smoking status: Former Smoker    Packs/day: 1.00    Years: 45.00    Pack years: 45.00  Types: Cigarettes    Quit date: 04/24/2015    Years since quitting: 3.8   Smokeless tobacco: Never Used   Tobacco comment: used to smoke 1PPD for 40 years  Substance Use Topics   Alcohol use: Yes    Comment: occasional   Drug use: No    Review of Systems  Constitutional: No fever/chills Eyes: No visual changes. ENT: No sore throat. Cardiovascular: Denies chest pain. Respiratory: Denies shortness of breath. Gastrointestinal: Currently no abdominal pain.  No nausea, no vomiting.  No diarrhea.  No constipation. Genitourinary: Negative for dysuria. Musculoskeletal: Negative for back pain. Skin: Negative  for rash. Neurological: Negative for headaches, focal weakness    ____________________________________________   PHYSICAL EXAM:  VITAL SIGNS: ED Triage Vitals  Enc Vitals Group     BP 02/27/19 1606 128/68     Pulse Rate 02/27/19 1606 70     Resp 02/27/19 1606 16     Temp 02/27/19 1606 (!) 97.5 F (36.4 C)     Temp Source 02/27/19 1606 Oral     SpO2 02/27/19 1606 95 %     Weight 02/27/19 1606 162 lb (73.5 kg)     Height 02/27/19 1606 5\' 4"  (1.626 m)     Head Circumference --      Peak Flow --      Pain Score 02/27/19 1610 0     Pain Loc --      Pain Edu? --      Excl. in [place]? --     Constitutional: Alert and oriented. Well appearing and in no acute distress. Eyes: Conjunctivae are normal. PERRL. EOMI. fundi appear to be normal to my exam Head: Atraumatic. Nose: No congestion/rhinnorhea. Mouth/Throat: Mucous membranes are moist.  Oropharynx non-erythematous. Neck: No stridor.  Cardiovascular: Normal rate, regular rhythm. Grossly normal heart sounds.  Good peripheral circulation. Respiratory: Normal respiratory effort.  No retractions. Lungs CTAB. Gastrointestinal: Soft and nontender. No distention. No abdominal bruits. No CVA tenderness. Musculoskeletal: No lower extremity tenderness nor edema.  No joint effusions. Neurologic:  Normal speech and language. No gross focal neurologic deficits are appreciated.  Cranial nerves II through XII are intact although visual fields were not checked.  There is no nystagmus cerebellar finger-nose rapid alternating movements and hands are normal motor strength is 5/5 throughout and patient does not report any numbness no gait instability. Skin:  Skin is warm, dry and intact. No rash noted. Psychiatric: Mood and affect are normal. Speech and behavior are normal.  ____________________________________________   LABS (all labs ordered are listed, but only abnormal results are displayed)  Labs Reviewed  COMPREHENSIVE METABOLIC PANEL -  Abnormal; Notable for the following components:      Result Value   Glucose, Bld 127 (*)    All other components within normal limits  CBC - Abnormal; Notable for the following components:   WBC 11.0 (*)    All other components within normal limits  URINALYSIS, COMPLETE (UACMP) WITH MICROSCOPIC - Abnormal; Notable for the following components:   Color, Urine YELLOW (*)    APPearance HAZY (*)    Protein, ur 30 (*)    Bacteria, UA RARE (*)    All other components within normal limits  LIPASE, BLOOD  TROPONIN I (HIGH SENSITIVITY)   ____________________________________________  EKG  EKG read interpreted by me shows normal sinus rhythm at a rate of 66 normal axis no acute changes are seen ____________________________________________  RADIOLOGY  ED MD interpretation:    Official radiology report(s): No  results found.  ____________________________________________   PROCEDURES  Procedure(s) performed (including Critical Care):  Procedures   ____________________________________________   INITIAL IMPRESSION / ASSESSMENT AND PLAN / ED COURSE  Patient symptoms have resolved but I cannot provoke him into returning.  He is able to tolerate p.o. very well.  I will let him go with some Zofran and some Antivert.  He will return if he is worse again.              ____________________________________________   FINAL CLINICAL IMPRESSION(S) / ED DIAGNOSES  Final diagnoses:  Vertigo  Non-intractable vomiting with nausea, unspecified vomiting type     ED Discharge Orders         Ordered    ondansetron (ZOFRAN ODT) 4 MG disintegrating tablet  Every 8 hours PRN     02/27/19 1925    meclizine (ANTIVERT) 25 MG tablet  3 times daily PRN     02/27/19 1925           Note:  This document was prepared using Dragon voice recognition software and may include unintentional dictation errors.    Arnaldo Natal, MD 02/27/19 1925

## 2019-02-27 NOTE — Discharge Instructions (Addendum)
Please return if the vertigo and vomiting return again.  If you are not here follow-up with the closest hospital.  If you have just a little bit of nausea you can try the Zofran melt on your tongue wafers 1 3 times a day as needed.  If you have a little bit of nausea and just a slight bit of spinning you can try the meclizine or Antivert 3 times a day as needed.  If the symptoms are severe or last longer than a few minutes please return to the hospital.

## 2019-02-27 NOTE — ED Triage Notes (Addendum)
C/O n/v today.  STates symptom onset was this morning at 0700.  Also c/o feeling dizzy sometimes when standing.  AAOx3.  Skin warm and dry. NAD  Patient states he is just recovered from [place].  Tested positive on 10/23 and just released by the Health Department.

## 2019-02-27 NOTE — ED Notes (Signed)

## 2019-05-19 ENCOUNTER — Encounter: Payer: Self-pay | Admitting: Cardiovascular Disease

## 2019-05-19 ENCOUNTER — Other Ambulatory Visit: Payer: Self-pay

## 2019-05-19 ENCOUNTER — Ambulatory Visit (INDEPENDENT_AMBULATORY_CARE_PROVIDER_SITE_OTHER): Payer: Medicare Other | Admitting: Cardiovascular Disease

## 2019-05-19 VITALS — BP 140/70 | HR 72 | Ht 64.0 in | Wt 168.5 lb

## 2019-05-19 DIAGNOSIS — E785 Hyperlipidemia, unspecified: Secondary | ICD-10-CM | POA: Diagnosis not present

## 2019-05-19 DIAGNOSIS — I2510 Atherosclerotic heart disease of native coronary artery without angina pectoris: Secondary | ICD-10-CM | POA: Diagnosis not present

## 2019-05-19 MED ORDER — ROSUVASTATIN CALCIUM 20 MG PO TABS: 20.0000 mg | ORAL_TABLET | Freq: Every day | ORAL | 3 refills | Status: DC

## 2019-05-19 MED ORDER — NITROGLYCERIN 0.4 MG SL SUBL: 0.4000 mg | SUBLINGUAL_TABLET | SUBLINGUAL | 1 refills | Status: DC | PRN

## 2019-05-19 MED ORDER — ICOSAPENT ETHYL 1 G PO CAPS: ORAL_CAPSULE | ORAL | 11 refills | Status: DC

## 2019-05-19 NOTE — Patient Instructions (Addendum)
Medication Instructions:  No medication changes *If you need a refill on your cardiac medications before your next appointment, please call your pharmacy*  Lab Work: None ordered If you have labs (blood work) drawn today and your tests are completely normal, you will receive your results only by: Marland Kitchen MyChart Message (if you have MyChart) OR . A paper copy in the mail If you have any lab test that is abnormal or we need to change your treatment, we will call you to review the results.  Testing/Procedures: None ordered  Follow-Up: At [facility], you and your health needs are our priority.  As part of our continuing mission to provide you with exceptional heart care, we have created designated Provider Care Teams.  These Care Teams include your primary Cardiologist (physician) and Advanced Practice Providers (APPs -  Physician Assistants and Nurse Practitioners) who all work together to provide you with the care you need, when you need it.  Your next appointment:   1 year(s)  The format for your next appointment:   In Person  Provider:    You may see No primary care provider on file. or one of the following Advanced Practice Providers on your designated Care Team:    Nicolasa Ducking, NP  Eula Listen, PA-C  Marisue Ivan, PA-C   Other Instructions COVID-19 Vaccine Information can be found at: [email or web address] For questions related to vaccine distribution or appointments, please email [email or web address] or call [phone].    cavs

## 2019-05-19 NOTE — Progress Notes (Signed)
Cardiology Office Note   Date:  05/19/2019   ID:  Matthew Arias, DOB [birth year], MRN [id number]  PCP:  Adin Hector, MD  Cardiologist:   Kathlyn Sacramento, MD   Chief Complaint  Patient presents with  . other    12 month follow up. Meds reviewed by the pt. verbally. "doing well."       History of Present Illness: Matthew Arias is a 64 y.o. male who presents for a follow-up visit regarding coronary artery disease. He has overall been healthy throughout his life and very active. He presented in 02/2016 with NSTEMI.  Cardiac catheterization  showed an occluded first diagonal with mild disease elsewhere. There was faint collaterals and the vessel was overall 2 mm in diameter with ostial location. I elected to treat him medically. Ejection fraction was 50-55%.  He has been doing very well with no recent chest pain, shortness of breath or palpitations.  He takes his medications regularly but recently started taking 3 capsules of Vascepa instead of 4 in order to save. He had COVID-19 infection in October but recovered completely.  His whole family were sick with it and his mother-in-law died of it.  Past Medical History:  Diagnosis Date  . Coronary artery disease    Non-ST elevation myocardial infarction in November 2017. Cardiac catheterization showed occluded first diagonal with faint collaterals. EF was 50-55%. There was mild nonobstructive disease otherwise. He was treated medically.  . Degenerative disc disease, lumbar   . Hyperlipidemia   . Lumbar degenerative disc disease   . Pulmonary nodules     Past Surgical History:  Procedure Laterality Date  . CARDIAC CATHETERIZATION N/A 03/12/2016   Procedure: Left Heart Cath and Coronary Angiography;  Surgeon: Wellington Hampshire, MD;  Location: [place] CV LAB;  Service: Cardiovascular;  Laterality: N/A;  . CARPAL TUNNEL RELEASE    . COLONOSCOPY    . COLONOSCOPY WITH PROPOFOL N/A 11/20/2017   Procedure: COLONOSCOPY WITH PROPOFOL;   Surgeon: Manya Silvas, MD;  Location: [facility] ENDOSCOPY;  Service: Endoscopy;  Laterality: N/A;  . LUMBAR LAMINECTOMY    . TONSILLECTOMY       Current Outpatient Medications  Medication Sig Dispense Refill  . albuterol (VENTOLIN HFA) 108 (90 Base) MCG/ACT inhaler Inhale 1-2 puffs into the lungs every 4 (four) hours as needed.     Marland Kitchen aspirin EC 81 MG EC tablet Take 1 tablet (81 mg total) by mouth daily.    Vanessa Kick Ethyl (VASCEPA) 1 g CAPS TAKE 2 TABLETS BY MOUTH 2 (TWO) TIMES DAILY WITH A MEAL. 120 capsule 11  . meclizine (ANTIVERT) 25 MG tablet Take 1 tablet (25 mg total) by mouth 3 (three) times daily as needed for dizziness. 30 tablet 0  . nitroGLYCERIN (NITROSTAT) 0.4 MG SL tablet Place 1 tablet (0.4 mg total) under the tongue every 5 (five) minutes as needed for chest pain. 25 tablet 1  . ondansetron (ZOFRAN ODT) 4 MG disintegrating tablet Take 1 tablet (4 mg total) by mouth every 8 (eight) hours as needed for nausea or vomiting. 20 tablet 0  . rosuvastatin (CRESTOR) 20 MG tablet Take 1 tablet (20 mg total) by mouth daily. 90 tablet 3   No current facility-administered medications for this visit.    Allergies:   Atorvastatin and Codeine    Social History:  The patient  reports that he quit smoking about 4 years ago. His smoking use included cigarettes. He has a 45.00 pack-year smoking  history. He has never used smokeless tobacco. He reports current alcohol use. He reports that he does not use drugs.   Family History:  The patient's family history includes Arthritis in his mother; CAD in his father.    ROS:  Please see the history of present illness.   Otherwise, review of systems are positive for none.   All other systems are reviewed and negative.    PHYSICAL EXAM: VS:  BP 140/70 (BP Location: Left Arm, Patient Position: Sitting, Cuff Size: Normal)   Pulse 72   Ht 5\' 4"  (1.626 m)   Wt 168 lb 8 oz (76.4 kg)   BMI 28.92 kg/m  , BMI Body mass index is 28.92 kg/m. GEN:  Well nourished, well developed, in no acute distress  HEENT: normal  Neck: no JVD, carotid bruits, or masses Cardiac: RRR; no murmurs, rubs, or gallops,no edema  Respiratory:  clear to auscultation bilaterally, normal work of breathing GI: soft, nontender, nondistended, + BS MS: no deformity or atrophy  Skin: warm and dry, no rash Neuro:  Strength and sensation are intact Psych: euthymic mood, full affect   EKG:  EKG is  ordered today. EKG showed normal sinus rhythm with no significant ST or T wave changes.   Recent Labs: 02/27/2019: ALT 42; BUN 17; Creatinine, Ser 0.88; Hemoglobin 14.8; Platelets 360; Potassium 3.9; Sodium 138    Lipid Panel    Component Value Date/Time   CHOL 125 06/21/2017 0757   CHOL 157 04/30/2016 0757   TRIG 127 06/21/2017 0757   HDL 34 (L) 06/21/2017 0757   HDL 41 04/30/2016 0757   CHOLHDL 3.7 06/21/2017 0757   VLDL 25 06/21/2017 0757   LDLCALC 66 06/21/2017 0757   LDLCALC 94 04/30/2016 0757      Wt Readings from Last 3 Encounters:  05/19/19 168 lb 8 oz (76.4 kg)  02/27/19 162 lb (73.5 kg)  05/02/18 165 lb (74.8 kg)      No flowsheet data found.    ASSESSMENT AND PLAN:  1. Coronary artery disease involving native coronary arteries without angina: He is doing extremely well with no anginal symptoms.   2. Hyperlipidemia: He has mixed hyperlipidemia and currently is on rosuvastatin and Vascepa.  Most recent lipid profile showed a triglyceride of 138 and an LDL of 82 which is close to target.  Continue same medications for now.  3.  Elevated blood pressure without history of hypertension: Usually his blood pressure is normal and he was rushing to the appointment.  Continue to monitor for now.   Disposition:   FU with me in 12 months  Signed,  07/01/18, MD  05/19/2019 3:02 PM    [facility]

## 2019-05-29 ENCOUNTER — Other Ambulatory Visit: Payer: Medicare Other

## 2019-06-05 ENCOUNTER — Ambulatory Visit: Payer: Medicare Other | Attending: Internal Medicine

## 2019-06-05 ENCOUNTER — Other Ambulatory Visit: Payer: Self-pay

## 2019-06-05 DIAGNOSIS — Z23 Encounter for immunization: Secondary | ICD-10-CM | POA: Insufficient documentation

## 2019-06-05 NOTE — Progress Notes (Signed)
   Covid-19 Vaccination Clinic  Name:  Matthew Arias    MRN: [id number] DOB: 04-Jul-1955  06/05/2019  [name] was observed post Covid-19 immunization for 15 minutes without incidence. He was provided with Vaccine Information Sheet and instruction to access the V-Safe system.   [name] was instructed to call 911 with any severe reactions post vaccine: Marland Kitchen Difficulty breathing  . Swelling of your face and throat  . A fast heartbeat  . A bad rash all over your body  . Dizziness and weakness    Immunizations Administered    Name Date Dose VIS Date Route   Pfizer COVID-19 Vaccine 06/05/2019 10:07 AM 0.3 mL 04/03/2019 Intramuscular   Manufacturer: [employer], [employer]   Lot: [id number]   NDC: [address]-6

## 2019-06-30 ENCOUNTER — Ambulatory Visit: Payer: Medicare Other | Attending: Internal Medicine

## 2019-06-30 DIAGNOSIS — Z23 Encounter for immunization: Secondary | ICD-10-CM

## 2019-06-30 NOTE — Progress Notes (Signed)
   Covid-19 Vaccination Clinic  Name:  JERICO GRISSO    MRN: [id number] DOB: [birth year]  06/30/2019  Mr. Mcghie was observed post Covid-19 immunization for 15 minutes without incident. He was provided with Vaccine Information Sheet and instruction to access the V-Safe system.   Mr. Trettin was instructed to call 911 with any severe reactions post vaccine: Marland Kitchen Difficulty breathing  . Swelling of face and throat  . A fast heartbeat  . A bad rash all over body  . Dizziness and weakness   Immunizations Administered    Name Date Dose VIS Date Route   Pfizer COVID-19 Vaccine 06/30/2019 10:30 AM 0.3 mL 04/03/2019 Intramuscular   Manufacturer: [employer], [employer]   Lot: [id number]   NDC: [address]-6

## 2020-01-21 ENCOUNTER — Other Ambulatory Visit: Payer: Self-pay | Admitting: Internal Medicine

## 2020-01-21 DIAGNOSIS — Z87891 Personal history of nicotine dependence: Secondary | ICD-10-CM

## 2020-01-21 DIAGNOSIS — I10 Essential (primary) hypertension: Secondary | ICD-10-CM

## 2020-01-21 DIAGNOSIS — Z136 Encounter for screening for cardiovascular disorders: Secondary | ICD-10-CM

## 2020-01-21 DIAGNOSIS — I700 Atherosclerosis of aorta: Secondary | ICD-10-CM

## 2020-02-04 ENCOUNTER — Ambulatory Visit
Admission: RE | Admit: 2020-02-04 | Discharge: 2020-02-04 | Disposition: A | Discharge status: Home / Self Care | Payer: Medicare Other | Source: Ambulatory Visit | Attending: Internal Medicine | Admitting: Internal Medicine

## 2020-02-04 ENCOUNTER — Other Ambulatory Visit: Payer: Self-pay

## 2020-02-04 DIAGNOSIS — Z87891 Personal history of nicotine dependence: Secondary | ICD-10-CM | POA: Diagnosis present

## 2020-02-04 DIAGNOSIS — I700 Atherosclerosis of aorta: Secondary | ICD-10-CM

## 2020-02-04 DIAGNOSIS — Z136 Encounter for screening for cardiovascular disorders: Secondary | ICD-10-CM

## 2020-02-04 DIAGNOSIS — I10 Essential (primary) hypertension: Secondary | ICD-10-CM

## 2020-05-10 ENCOUNTER — Ambulatory Visit: Payer: Medicare Other | Admitting: Cardiovascular Disease

## 2020-05-11 ENCOUNTER — Other Ambulatory Visit: Payer: Self-pay

## 2020-05-11 ENCOUNTER — Ambulatory Visit: Payer: Medicare Other | Admitting: Cardiovascular Disease

## 2020-05-11 ENCOUNTER — Other Ambulatory Visit: Payer: Self-pay | Admitting: Cardiovascular Disease

## 2020-05-11 ENCOUNTER — Encounter: Payer: Self-pay | Admitting: Cardiovascular Disease

## 2020-05-11 VITALS — BP 138/70 | HR 78 | Ht 64.0 in | Wt 168.0 lb

## 2020-05-11 DIAGNOSIS — E785 Hyperlipidemia, unspecified: Secondary | ICD-10-CM | POA: Diagnosis not present

## 2020-05-11 DIAGNOSIS — I2510 Atherosclerotic heart disease of native coronary artery without angina pectoris: Secondary | ICD-10-CM | POA: Diagnosis not present

## 2020-05-11 NOTE — Patient Instructions (Signed)
Medication Instructions:  Your physician recommends that you continue on your current medications as directed. Please refer to the Current Medication list given to you today.  *If you need a refill on your cardiac medications before your next appointment, please call your pharmacy*   Lab Work: None ordered If you have labs (blood work) drawn today and your tests are completely normal, you will receive your results only by: . MyChart Message (if you have MyChart) OR . A paper copy in the mail If you have any lab test that is abnormal or we need to change your treatment, we will call you to review the results.   Testing/Procedures: None ordered   Follow-Up: At [facility], you and your health needs are our priority.  As part of our continuing mission to provide you with exceptional heart care, we have created designated Provider Care Teams.  These Care Teams include your primary Cardiologist (physician) and Advanced Practice Providers (APPs -  Physician Assistants and Nurse Practitioners) who all work together to provide you with the care you need, when you need it.  We recommend signing up for the patient portal called "MyChart".  Sign up information is provided on this After Visit Summary.  MyChart is used to connect with patients for Virtual Visits (Telemedicine).  Patients are able to view lab/test results, encounter notes, upcoming appointments, etc.  Non-urgent messages can be sent to your provider as well.   To learn more about what you can do with MyChart, go to [email or web address].mychart.com.    Your next appointment:   Your physician wants you to follow-up in: 1 year You will receive a reminder letter in the mail two months in advance. If you don't receive a letter, please call our office to schedule the follow-up appointment.   The format for your next appointment:   In Person  Provider:   You may see Dr. Arida or one of the following Advanced Practice Providers on your  designated Care Team:    Christopher Berge, NP  Ryan Dunn, PA-C  Jacquelyn Visser, PA-C  Cadence Furth, PA-C  Caitlin Walker, NP    Other Instructions N/A  

## 2020-05-11 NOTE — Progress Notes (Signed)
Cardiology Office Note   Date:  05/11/2020   ID:  Matthew Arias, DOB Aug 27, 1955, MRN [id number]  PCP:  [name], MD  Cardiologist:   [name], MD   Chief Complaint  Patient presents with  . Other    12 month follow up. Meds reviewed verbally with patient.       History of Present Illness: Matthew Arias is a 65 y.o. male who presents for a follow-up visit regarding coronary artery disease. He has overall been healthy throughout his life and very active. He presented in 02/2016 with NSTEMI.  Cardiac catheterization  showed an occluded first diagonal with mild disease elsewhere. There was faint collaterals and the vessel was overall 2 mm in diameter with ostial location. I elected to treat him medically. Ejection fraction was 50-55%.  He had COVID-19 infection in October 2020. He has been doing very well with no recent chest pain, shortness of breath or palpitations.  He takes his medications regularly.  He continues to walk 1-1/2 miles daily for exercise and in addition he does weightlifting.  Past Medical History:  Diagnosis Date  . Coronary artery disease    Non-ST elevation myocardial infarction in November 2017. Cardiac catheterization showed occluded first diagonal with faint collaterals. EF was 50-55%. There was mild nonobstructive disease otherwise. He was treated medically.  . Degenerative disc disease, lumbar   . Hyperlipidemia   . Lumbar degenerative disc disease   . Pulmonary nodules     Past Surgical History:  Procedure Laterality Date  . CARDIAC CATHETERIZATION N/A 03/12/2016   Procedure: Left Heart Cath and Coronary Angiography;  Surgeon: Iran Ouch, MD;  Location: [facility] INVASIVE CV LAB;  Service: Cardiovascular;  Laterality: N/A;  . CARPAL TUNNEL RELEASE    . COLONOSCOPY    . COLONOSCOPY WITH PROPOFOL N/A 11/20/2017   Procedure: COLONOSCOPY WITH PROPOFOL;  Surgeon: Scot Jun, MD;  Location: [facility] ENDOSCOPY;  Service: Endoscopy;   Laterality: N/A;  . LUMBAR LAMINECTOMY    . TONSILLECTOMY       Current Outpatient Medications  Medication Sig Dispense Refill  . albuterol (VENTOLIN HFA) 108 (90 Base) MCG/ACT inhaler Inhale 1-2 puffs into the lungs every 4 (four) hours as needed.     Marland Kitchen aspirin EC 81 MG EC tablet Take 1 tablet (81 mg total) by mouth daily.    Marland Kitchen icosapent Ethyl (VASCEPA) 1 g capsule TAKE 2 CAPSULES BY MOUTH TWICE DAILY WITH A MEAL 120 capsule 0  . meclizine (ANTIVERT) 25 MG tablet Take 1 tablet (25 mg total) by mouth 3 (three) times daily as needed for dizziness. 30 tablet 0  . nitroGLYCERIN (NITROSTAT) 0.4 MG SL tablet Place 1 tablet (0.4 mg total) under the tongue every 5 (five) minutes as needed for chest pain. 25 tablet 1  . ondansetron (ZOFRAN ODT) 4 MG disintegrating tablet Take 1 tablet (4 mg total) by mouth every 8 (eight) hours as needed for nausea or vomiting. 20 tablet 0  . rosuvastatin (CRESTOR) 20 MG tablet Take 1 tablet (20 mg total) by mouth daily. 90 tablet 3   No current facility-administered medications for this visit.    Allergies:   Atorvastatin and Codeine    Social History:  The patient  reports that he quit smoking about 5 years ago. His smoking use included cigarettes. He has a 45.00 pack-year smoking history. He has never used smokeless tobacco. He reports current alcohol use. He reports that he does not use drugs.  Family History:  The patient's family history includes Arthritis in his mother; CAD in his father.    ROS:  Please see the history of present illness.   Otherwise, review of systems are positive for none.   All other systems are reviewed and negative.    PHYSICAL EXAM: VS:  BP 138/70 (BP Location: Left Arm, Patient Position: Sitting, Cuff Size: Normal)   Pulse 78   Ht 5\' 4"  (1.626 m)   Wt 168 lb (76.2 kg)   SpO2 96%   BMI 28.84 kg/m  , BMI Body mass index is 28.84 kg/m. GEN: Well nourished, well developed, in no acute distress  HEENT: normal  Neck: no  JVD, carotid bruits, or masses Cardiac: RRR; no murmurs, rubs, or gallops,no edema  Respiratory:  clear to auscultation bilaterally, normal work of breathing GI: soft, nontender, nondistended, + BS MS: no deformity or atrophy  Skin: warm and dry, no rash Neuro:  Strength and sensation are intact Psych: euthymic mood, full affect   EKG:  EKG is  ordered today. EKG showed normal sinus rhythm with no significant ST or T wave changes.   Recent Labs: No results found for requested labs within last 8760 hours.    Lipid Panel    Component Value Date/Time   CHOL 125 06/21/2017 0757   CHOL 157 04/30/2016 0757   TRIG 127 06/21/2017 0757   HDL 34 (L) 06/21/2017 0757   HDL 41 04/30/2016 0757   CHOLHDL 3.7 06/21/2017 0757   VLDL 25 06/21/2017 0757   LDLCALC 66 06/21/2017 0757   LDLCALC 94 04/30/2016 0757      Wt Readings from Last 3 Encounters:  05/11/20 168 lb (76.2 kg)  05/19/19 168 lb 8 oz (76.4 kg)  02/27/19 162 lb (73.5 kg)      No flowsheet data found.    ASSESSMENT AND PLAN:  1. Coronary artery disease involving native coronary arteries without angina: He is doing extremely well with no anginal symptoms.   2. Hyperlipidemia: He has mixed hyperlipidemia and currently is on rosuvastatin and Vascepa.  I reviewed most recent lipid profile done in September which showed an LDL of 64 and triglyceride of 189.  At that time, he was taking 3 capsules of Vascepa instead of four and since then he went back to taking 4 capsules.  That should get his triglyceride lower.    Disposition:   FU with me in 12 months  Signed,  October, MD  05/11/2020 9:36 AM    [facility]

## 2020-05-12 ENCOUNTER — Ambulatory Visit: Payer: Medicare Other | Admitting: Physician Assistant

## 2020-05-13 ENCOUNTER — Other Ambulatory Visit: Payer: Self-pay | Admitting: Cardiovascular Disease

## 2020-07-20 ENCOUNTER — Other Ambulatory Visit: Payer: Self-pay | Admitting: Cardiovascular Disease

## 2020-11-19 ENCOUNTER — Other Ambulatory Visit: Payer: Self-pay | Admitting: Cardiovascular Disease

## 2020-12-18 ENCOUNTER — Other Ambulatory Visit: Payer: Self-pay | Admitting: Cardiovascular Disease

## 2021-04-17 ENCOUNTER — Other Ambulatory Visit: Payer: Self-pay | Admitting: Cardiovascular Disease

## 2021-04-18 ENCOUNTER — Other Ambulatory Visit: Payer: Self-pay | Admitting: *Deleted

## 2021-04-18 DIAGNOSIS — Z87891 Personal history of nicotine dependence: Secondary | ICD-10-CM

## 2021-05-09 NOTE — Progress Notes (Signed)
Virtual Visit via Telephone Note  I connected with Matthew Arias on 05/09/21 at 11:00 AM EST by telephone and verified that I am speaking with the correct person using two identifiers.  Location: Patient:  At home Provider:  [address] [place], [place], [address]    I discussed the limitations, risks, security and privacy concerns of performing an evaluation and management service by telephone and the availability of in person appointments. I also discussed with the patient that there may be a patient responsible charge related to this service. The patient expressed understanding and agreed to proceed.  Shared Decision Making Visit Lung Cancer Screening Program [phone])   Eligibility: Age 66 y.o. Pack Years Smoking History Calculation 46 pack year smoking history (# packs/per year x # years smoked) Recent History of coughing up blood  no Unexplained weight loss? no ( >Than 15 pounds within the last 6 months ) Prior History Lung / other cancer no (Diagnosis within the last 5 years already requiring surveillance chest CT Scans). Smoking Status Former Smoker Former Smokers: Years since quit: 6 years  Quit Date: 2017  Visit Components: Discussion included one or more decision making aids. yes Discussion included risk/benefits of screening. yes Discussion included potential follow up diagnostic testing for abnormal scans. yes Discussion included meaning and risk of over diagnosis. yes Discussion included meaning and risk of False Positives. yes Discussion included meaning of total radiation exposure. yes  Counseling Included: Importance of adherence to annual lung cancer LDCT screening. yes Impact of comorbidities on ability to participate in the program. yes Ability and willingness to under diagnostic treatment. yes  Smoking Cessation Counseling: Current Smokers:  Discussed importance of smoking cessation. yes Information about tobacco cessation classes and interventions  provided to patient. yes Patient provided with "ticket" for LDCT Scan. yes Symptomatic Patient. no  Counseling NA Diagnosis Code: Tobacco Use Z72.0 Asymptomatic Patient yes  Counseling (Intermediate counseling: > three minutes counseling) U0454 Former Smokers:  Discussed the importance of maintaining cigarette abstinence. yes Diagnosis Code: Personal History of Nicotine Dependence. U98.119 Information about tobacco cessation classes and interventions provided to patient. Yes Patient provided with "ticket" for LDCT Scan. yes Written Order for Lung Cancer Screening with LDCT placed in Epic. Yes (CT Chest Lung Cancer Screening Low Dose W/O CM) [id number] Z12.2-Screening of respiratory organs Z87.891-Personal history of nicotine dependence  I spent 25 minutes of face to face time/virtual visit time  with  Matthew Arias discussing the risks and benefits of lung cancer screening. We took the time to pause the power point at intervals to allow for questions to be asked and answered to ensure understanding. We discussed that he had taken the single most powerful action possible to decrease his risk of developing lung cancer when he quit smoking. I counseled him to remain smoke free, and to contact me if he ever had the desire to smoke again so that I can provide resources and tools to help support the effort to remain smoke free. We discussed the time and location of the scan, and that either  Abigail Miyamoto RN, Karlton Lemon, RN or I  or I will call / send a letter with the results within  24-72 hours of receiving them. He has the office contact information in the event he needs to speak with me,  he verbalized understanding of all of the above and had no further questions upon leaving the office.     I explained to the patient that there has been  a high incidence of coronary artery disease noted on these exams. I explained that this is a non-gated exam therefore degree or severity cannot be determined. This  patient is on statin therapy. I have asked the patient to follow-up with their PCP regarding any incidental finding of coronary artery disease and management with diet or medication as they feel is clinically indicated. The patient verbalized understanding of the above and had no further questions.     Bevelyn Ngo, NP 05/10/2021

## 2021-05-10 ENCOUNTER — Other Ambulatory Visit: Payer: Self-pay

## 2021-05-10 ENCOUNTER — Encounter: Payer: Self-pay | Admitting: Acute Care

## 2021-05-10 ENCOUNTER — Ambulatory Visit
Admission: RE | Admit: 2021-05-10 | Discharge: 2021-05-10 | Disposition: A | Discharge status: Home / Self Care | Payer: Medicare Other | Source: Ambulatory Visit | Attending: Acute Care | Admitting: Acute Care

## 2021-05-10 ENCOUNTER — Ambulatory Visit (INDEPENDENT_AMBULATORY_CARE_PROVIDER_SITE_OTHER): Payer: Medicare Other | Admitting: Acute Care

## 2021-05-10 DIAGNOSIS — Z87891 Personal history of nicotine dependence: Secondary | ICD-10-CM | POA: Diagnosis not present

## 2021-05-10 NOTE — Patient Instructions (Signed)
Thank you for participating in the [facility] Screening [facility]. °It was our pleasure to meet you today. °We will call you with the results of your scan within the next few days. °Your scan will be assigned a Lung RADS category score by the physicians reading the scans.  °This Lung RADS score determines follow up scanning.  °See below for description of categories, and follow up screening recommendations. °We will be in touch to schedule your follow up screening annually or based on recommendations of our providers. °We will fax a copy of your scan results to your Primary Care Physician, or the physician who referred you to the program, to ensure they have the results. °Please call the office if you have any questions or concerns regarding your scanning experience or results.  °Our office number is [phone]. °Please speak with Denise Phelps, RN. She is our Lung Cancer Screening RN. °If she is unavailable when you call, please have the office staff send her a message. She will return your call at her earliest convenience. °Remember, if your scan is normal, we will scan you annually as long as you continue to meet the criteria for the program. (Age 55-77, Current smoker or smoker who has quit within the last 15 years). °If you are a smoker, remember, quitting is the single most powerful action that you can take to decrease your risk of lung cancer and other pulmonary, breathing related problems. °We know quitting is hard, and we are here to help.  °Please let us know if there is anything we can do to help you meet your goal of quitting. °If you are a former smoker, congratulations. We are proud of you! Remain smoke free! °Remember you can refer friends or family members through the number above.  °We will screen them to make sure they meet criteria for the program. °Thank you for helping us take better care of you by participating in Lung Screening. ° °You can receive free nicotine replacement therapy  ( patches, gum or mints) by calling 1-800-QUIT NOW. Please call so we can get you on the path to becoming  a non-smoker. I know it is hard, but you can do this! ° °Lung RADS Categories: ° °Lung RADS 1: no nodules or definitely non-concerning nodules.  °Recommendation is for a repeat annual scan in 12 months. ° °Lung RADS 2:  nodules that are non-concerning in appearance and behavior with a very low likelihood of becoming an active cancer. °Recommendation is for a repeat annual scan in 12 months. ° °Lung RADS 3: nodules that are probably non-concerning , includes nodules with a low likelihood of becoming an active cancer.  Recommendation is for a 6-month repeat screening scan. Often noted after an upper respiratory illness. We will be in touch to make sure you have no questions, and to schedule your 6-month scan. ° °Lung RADS 4 A: nodules with concerning findings, recommendation is most often for a follow up scan in 3 months or additional testing based on our provider's assessment of the scan. We will be in touch to make sure you have no questions and to schedule the recommended 3 month follow up scan. ° °Lung RADS 4 B:  indicates findings that are concerning. We will be in touch with you to schedule additional diagnostic testing based on our provider's  assessment of the scan. ° °Hypnosis for smoking cessation  °[employer] °[phone] ° °Acupuncture for smoking cessation  °[facility] °[phone]  °

## 2021-05-11 ENCOUNTER — Ambulatory Visit: Payer: Medicare Other | Admitting: Cardiovascular Disease

## 2021-05-11 ENCOUNTER — Other Ambulatory Visit: Payer: Self-pay

## 2021-05-11 DIAGNOSIS — Z87891 Personal history of nicotine dependence: Secondary | ICD-10-CM

## 2021-05-18 ENCOUNTER — Ambulatory Visit: Payer: Medicare Other | Admitting: Cardiovascular Disease

## 2021-06-01 ENCOUNTER — Ambulatory Visit: Payer: Medicare Other | Admitting: Cardiovascular Disease

## 2021-06-01 ENCOUNTER — Other Ambulatory Visit: Payer: Self-pay

## 2021-06-01 ENCOUNTER — Encounter: Payer: Self-pay | Admitting: Cardiovascular Disease

## 2021-06-01 VITALS — BP 120/70 | HR 80 | Ht 64.0 in | Wt 155.1 lb

## 2021-06-01 DIAGNOSIS — E785 Hyperlipidemia, unspecified: Secondary | ICD-10-CM | POA: Diagnosis not present

## 2021-06-01 DIAGNOSIS — I2510 Atherosclerotic heart disease of native coronary artery without angina pectoris: Secondary | ICD-10-CM | POA: Diagnosis not present

## 2021-06-01 NOTE — Progress Notes (Signed)
Cardiology Office Note   Date:  06/01/2021   ID:  Matthew Arias, DOB [birth year], MRN [id number]  PCP:  [name], MD  Cardiologist:   [name], MD   Chief Complaint  Patient presents with   Other    12 Month f/u no complaints today. Meds reviewed verbally with pt.      History of Present Illness: Matthew Arias is a 66 y.o. male who presents for a follow-up visit regarding coronary artery disease. He has overall been healthy throughout his life and very active. He presented in 02/2016 with NSTEMI.  Cardiac catheterization showed an occluded first diagonal with mild disease elsewhere. There was faint collaterals and the vessel was overall 2 mm in diameter with ostial location. I elected to treat him medically. Ejection fraction was 50-55%.  He had COVID-19 infection in October 2020.  Doing very well with no chest pain, shortness of breath or palpitations.  He exercises on a daily basis as he walks 1 to 2 miles and he does weightlifting 3 times a week. She will commence Past Medical History:  Diagnosis Date   Coronary artery disease    Non-ST elevation myocardial infarction in November 2017. Cardiac catheterization showed occluded first diagonal with faint collaterals. EF was 50-55%. There was mild nonobstructive disease otherwise. He was treated medically.   Degenerative disc disease, lumbar    Hyperlipidemia    Lumbar degenerative disc disease    Pulmonary nodules     Past Surgical History:  Procedure Laterality Date   CARDIAC CATHETERIZATION N/A 03/12/2016   Procedure: Left Heart Cath and Coronary Angiography;  Surgeon: Iran Ouch, MD;  Location: [facility] INVASIVE CV LAB;  Service: Cardiovascular;  Laterality: N/A;   CARPAL TUNNEL RELEASE     COLONOSCOPY     COLONOSCOPY WITH PROPOFOL N/A 11/20/2017   Procedure: COLONOSCOPY WITH PROPOFOL;  Surgeon: Scot Jun, MD;  Location: [facility] ENDOSCOPY;  Service: Endoscopy;  Laterality: N/A;   LUMBAR LAMINECTOMY      TONSILLECTOMY       Current Outpatient Medications  Medication Sig Dispense Refill   albuterol (VENTOLIN HFA) 108 (90 Base) MCG/ACT inhaler Inhale 1-2 puffs into the lungs every 4 (four) hours as needed.      aspirin EC 81 MG EC tablet Take 1 tablet (81 mg total) by mouth daily.     icosapent Ethyl (VASCEPA) 1 g capsule TAKE 2 CAPSULES BY MOUTH TWICE DAILY WITH A MEAL 120 capsule 0   meclizine (ANTIVERT) 25 MG tablet Take 1 tablet (25 mg total) by mouth 3 (three) times daily as needed for dizziness. 30 tablet 0   nitroGLYCERIN (NITROSTAT) 0.4 MG SL tablet Place 1 tablet (0.4 mg total) under the tongue every 5 (five) minutes as needed for chest pain. 25 tablet 1   ondansetron (ZOFRAN ODT) 4 MG disintegrating tablet Take 1 tablet (4 mg total) by mouth every 8 (eight) hours as needed for nausea or vomiting. 20 tablet 0   rosuvastatin (CRESTOR) 20 MG tablet TAKE 1 TABLET(20 MG) BY MOUTH DAILY 90 tablet 3   No current facility-administered medications for this visit.    Allergies:   Atorvastatin and Codeine    Social History:  The patient  reports that he quit smoking about 6 years ago. His smoking use included cigarettes. He has a 45.00 pack-year smoking history. He has never used smokeless tobacco. He reports current alcohol use. He reports that he does not use drugs.   Family  History:  The patient's family history includes Arthritis in his mother; CAD in his father.    ROS:  Please see the history of present illness.   Otherwise, review of systems are positive for none.   All other systems are reviewed and negative.    PHYSICAL EXAM: VS:  BP 120/70 (BP Location: Left Arm, Patient Position: Sitting, Cuff Size: Normal)    Pulse 80    Ht 5\' 4"  (1.626 m)    Wt 155 lb 2 oz (70.4 kg)    SpO2 97%    BMI 26.63 kg/m  , BMI Body mass index is 26.63 kg/m. GEN: Well nourished, well developed, in no acute distress  HEENT: normal  Neck: no JVD, carotid bruits, or masses Cardiac: RRR; no murmurs,  rubs, or gallops,no edema  Respiratory:  clear to auscultation bilaterally, normal work of breathing GI: soft, nontender, nondistended, + BS MS: no deformity or atrophy  Skin: warm and dry, no rash Neuro:  Strength and sensation are intact Psych: euthymic mood, full affect   EKG:  EKG is  ordered today. EKG showed normal sinus rhythm with no significant ST or T wave changes.   Recent Labs: No results found for requested labs within last 8760 hours.    Lipid Panel    Component Value Date/Time   CHOL 125 06/21/2017 0757   CHOL 157 04/30/2016 0757   TRIG 127 06/21/2017 0757   HDL 34 (L) 06/21/2017 0757   HDL 41 04/30/2016 0757   CHOLHDL 3.7 06/21/2017 0757   VLDL 25 06/21/2017 0757   LDLCALC 66 06/21/2017 0757   LDLCALC 94 04/30/2016 0757      Wt Readings from Last 3 Encounters:  06/01/21 155 lb 2 oz (70.4 kg)  05/10/21 150 lb (68 kg)  05/11/20 168 lb (76.2 kg)      No flowsheet data found.    ASSESSMENT AND PLAN:  1. Coronary artery disease involving native coronary arteries without angina: He is doing extremely well with no anginal symptoms.   2. Hyperlipidemia: He has mixed hyperlipidemia and currently is on rosuvastatin and Vascepa.   I reviewed most recent lipid profile done in September which showed an LDL of 74 and triglyceride of 180.  The rest of his labs were unremarkable.    Disposition:   FU with me in 12 months  Signed,  October, MD  06/01/2021 2:05 PM    [facility]

## 2021-06-01 NOTE — Patient Instructions (Signed)

## 2021-06-18 ENCOUNTER — Other Ambulatory Visit: Payer: Self-pay | Admitting: Cardiovascular Disease

## 2021-09-14 ENCOUNTER — Other Ambulatory Visit: Payer: Self-pay | Admitting: Cardiovascular Disease

## 2022-05-10 ENCOUNTER — Ambulatory Visit
Admission: RE | Admit: 2022-05-10 | Discharge: 2022-05-10 | Disposition: A | Discharge status: Home / Self Care | Payer: Medicare Other | Source: Ambulatory Visit | Attending: Internal Medicine | Admitting: Internal Medicine

## 2022-05-10 DIAGNOSIS — Z87891 Personal history of nicotine dependence: Secondary | ICD-10-CM

## 2022-05-29 ENCOUNTER — Encounter: Payer: Self-pay | Admitting: Cardiovascular Disease

## 2022-05-29 ENCOUNTER — Ambulatory Visit: Payer: Medicare Other | Attending: Cardiovascular Disease | Admitting: Cardiovascular Disease

## 2022-05-29 VITALS — BP 110/60 | HR 74 | Ht 64.0 in | Wt 160.1 lb

## 2022-05-29 DIAGNOSIS — I2510 Atherosclerotic heart disease of native coronary artery without angina pectoris: Secondary | ICD-10-CM | POA: Diagnosis not present

## 2022-05-29 DIAGNOSIS — E785 Hyperlipidemia, unspecified: Secondary | ICD-10-CM

## 2022-05-29 MED ORDER — NITROGLYCERIN 0.4 MG SL SUBL: 0.4000 mg | SUBLINGUAL_TABLET | SUBLINGUAL | 0 refills | Status: DC | PRN

## 2022-05-29 NOTE — Progress Notes (Signed)
Cardiology Office Note   Date:  05/29/2022   ID:  Matthew Arias, DOB Mar 31, 1956, MRN [id number]  PCP:  Adin Hector, MD  Cardiologist:   Kathlyn Sacramento, MD   Chief Complaint  Patient presents with   Other    12 month f/u no complaints today. Meds reviewed verbally with pt.      History of Present Illness: Matthew Arias is a 67 y.o. male who presents for a follow-up visit regarding coronary artery disease. He has overall been healthy throughout his life and very active. He presented in 02/2016 with NSTEMI.  Cardiac catheterization showed an occluded first diagonal with mild disease elsewhere. There were faint collaterals and the vessel was overall 2 mm in diameter with ostial location. I elected to treat him medically. Ejection fraction was 50-55%.  He had COVID-19 infection in October 2020.  Doing very well with no chest pain, shortness of breath or palpitations.  He walks on the treadmill daily and does weightlifting 2-3 times every week.   Past Medical History:  Diagnosis Date   Coronary artery disease    Non-ST elevation myocardial infarction in November 2017. Cardiac catheterization showed occluded first diagonal with faint collaterals. EF was 50-55%. There was mild nonobstructive disease otherwise. He was treated medically.   Degenerative disc disease, lumbar    Hyperlipidemia    Lumbar degenerative disc disease    Pulmonary nodules     Past Surgical History:  Procedure Laterality Date   CARDIAC CATHETERIZATION N/A 03/12/2016   Procedure: Left Heart Cath and Coronary Angiography;  Surgeon: Wellington Hampshire, MD;  Location: [facility];  Service: Cardiovascular;  Laterality: N/A;   CARPAL TUNNEL RELEASE     COLONOSCOPY     COLONOSCOPY WITH PROPOFOL N/A 11/20/2017   Procedure: COLONOSCOPY WITH PROPOFOL;  Surgeon: Manya Silvas, MD;  Location: [facility] ENDOSCOPY;  Service: Endoscopy;  Laterality: N/A;   LUMBAR LAMINECTOMY     TONSILLECTOMY       Current  Outpatient Medications  Medication Sig Dispense Refill   albuterol (VENTOLIN HFA) 108 (90 Base) MCG/ACT inhaler Inhale 1-2 puffs into the lungs every 4 (four) hours as needed.      aspirin EC 81 MG EC tablet Take 1 tablet (81 mg total) by mouth daily.     icosapent Ethyl (VASCEPA) 1 g capsule TAKE 2 CAPSULES BY MOUTH TWICE DAILY WITH A MEAL 120 capsule 7   meclizine (ANTIVERT) 25 MG tablet Take 1 tablet (25 mg total) by mouth 3 (three) times daily as needed for dizziness. 30 tablet 0   nitroGLYCERIN (NITROSTAT) 0.4 MG SL tablet Place 1 tablet (0.4 mg total) under the tongue every 5 (five) minutes as needed for chest pain. 25 tablet 1   ondansetron (ZOFRAN ODT) 4 MG disintegrating tablet Take 1 tablet (4 mg total) by mouth every 8 (eight) hours as needed for nausea or vomiting. 20 tablet 0   rosuvastatin (CRESTOR) 20 MG tablet TAKE 1 TABLET(20 MG) BY MOUTH DAILY 90 tablet 3   No current facility-administered medications for this visit.    Allergies:   Atorvastatin and Codeine    Social History:  The patient  reports that he quit smoking about 7 years ago. His smoking use included cigarettes. He has a 45.00 pack-year smoking history. He has never used smokeless tobacco. He reports current alcohol use. He reports that he does not use drugs.   Family History:  The patient's family history includes Arthritis in  his mother; CAD in his father.    ROS:  Please see the history of present illness.   Otherwise, review of systems are positive for none.   All other systems are reviewed and negative.    PHYSICAL EXAM: VS:  BP 110/60 (BP Location: Left Arm, Patient Position: Sitting, Cuff Size: Normal)   Pulse 74   Ht 5\' 4"  (1.626 m)   Wt 160 lb 2 oz (72.6 kg)   SpO2 98%   BMI 27.49 kg/m  , BMI Body mass index is 27.49 kg/m. GEN: Well nourished, well developed, in no acute distress  HEENT: normal  Neck: no JVD, carotid bruits, or masses Cardiac: RRR; no murmurs, rubs, or gallops,no edema   Respiratory:  clear to auscultation bilaterally, normal work of breathing GI: soft, nontender, nondistended, + BS MS: no deformity or atrophy  Skin: warm and dry, no rash Neuro:  Strength and sensation are intact Psych: euthymic mood, full affect   EKG:  EKG is  ordered today. EKG showed normal sinus rhythm with no significant ST or T wave changes.   Recent Labs: No results found for requested labs within last 365 days.    Lipid Panel    Component Value Date/Time   CHOL 125 06/21/2017 0757   CHOL 157 04/30/2016 0757   TRIG 127 06/21/2017 0757   HDL 34 (L) 06/21/2017 0757   HDL 41 04/30/2016 0757   CHOLHDL 3.7 06/21/2017 0757   VLDL 25 06/21/2017 0757   LDLCALC 66 06/21/2017 0757   LDLCALC 94 04/30/2016 0757      Wt Readings from Last 3 Encounters:  05/29/22 160 lb 2 oz (72.6 kg)  06/01/21 155 lb 2 oz (70.4 kg)  05/10/21 150 lb (68 kg)          No data to display            ASSESSMENT AND PLAN:  1. Coronary artery disease involving native coronary arteries without angina: He is doing extremely well with no anginal symptoms.   2. Hyperlipidemia: He has mixed hyperlipidemia and currently is on rosuvastatin and Vascepa.   I reviewed most recent lipid profile done in September 2023 which showed an LDL of 68 and triglyceride of 129.  Both at target.      Disposition:   FU with me in 12 months  Signed,  Kathlyn Sacramento, MD  05/29/2022 1:52 PM    [facility]

## 2022-05-29 NOTE — Patient Instructions (Signed)
Medication Instructions:  No changes *If you need a refill on your cardiac medications before your next appointment, please call your pharmacy*   Lab Work: None ordered If you have labs (blood work) drawn today and your tests are completely normal, you will receive your results only by: [place] (if you have MyChart) OR A paper copy in the mail If you have any lab test that is abnormal or we need to change your treatment, we will call you to review the results.   Testing/Procedures: None ordered   Follow-Up: At [facility] you and your health needs are our priority.  As part of our continuing mission to provide you with exceptional heart care, we have created designated Provider Care Teams.  These Care Teams include your primary Cardiologist (physician) and Advanced Practice Providers (APPs -  Physician Assistants and Nurse Practitioners) who all work together to provide you with the care you need, when you need it.  We recommend signing up for the patient portal called "MyChart".  Sign up information is provided on this After Visit Summary.  MyChart is used to connect with patients for Virtual Visits (Telemedicine).  Patients are able to view lab/test results, encounter notes, upcoming appointments, etc.  Non-urgent messages can be sent to your provider as well.   To learn more about what you can do with MyChart, go to [email or web address].    Your next appointment:   12 month(s)  Provider:   You may see Dr. Fletcher Anon or one of the following Advanced Practice Providers on your designated Care Team:   Murray Hodgkins, NP Christell Faith, PA-C Cadence Kathlen Mody, PA-C Gerrie Nordmann, NP

## 2022-06-01 ENCOUNTER — Other Ambulatory Visit: Payer: Self-pay

## 2022-06-01 DIAGNOSIS — Z122 Encounter for screening for malignant neoplasm of respiratory organs: Secondary | ICD-10-CM

## 2022-06-01 DIAGNOSIS — Z87891 Personal history of nicotine dependence: Secondary | ICD-10-CM

## 2022-06-06 ENCOUNTER — Other Ambulatory Visit: Payer: Self-pay | Admitting: Cardiovascular Disease

## 2022-06-07 ENCOUNTER — Other Ambulatory Visit: Payer: Self-pay | Admitting: Cardiovascular Disease

## 2022-06-22 ENCOUNTER — Other Ambulatory Visit: Payer: Self-pay | Admitting: Cardiovascular Disease

## 2022-12-05 ENCOUNTER — Telehealth: Payer: Self-pay | Admitting: Cardiovascular Disease

## 2022-12-05 MED ORDER — NITROGLYCERIN 0.4 MG SL SUBL: 0.4000 mg | SUBLINGUAL_TABLET | SUBLINGUAL | 1 refills | Status: DC | PRN

## 2022-12-05 NOTE — Telephone Encounter (Signed)
*  STAT* If patient is at the pharmacy, call can be transferred to refill team.   1. Which medications need to be refilled? (please list name of each medication and dose if known) nitroGLYCERIN (NITROSTAT) 0.4 MG SL tablet   2. Which pharmacy/location (including street and city if local pharmacy) is medication to be sent to? [id number] - [place], [place] - [address] AT [facility] OF [address]    3. Do they need a 30 day or 90 day supply? 25 Tablets

## 2022-12-05 NOTE — Telephone Encounter (Signed)
Requested Prescriptions   Signed Prescriptions Disp Refills   nitroGLYCERIN (NITROSTAT) 0.4 MG SL tablet 25 tablet 1    Sig: Place 1 tablet (0.4 mg total) under the tongue every 5 (five) minutes as needed for chest pain.    Authorizing Provider: [name] A    Ordering User: Guerry Minors

## 2023-04-27 ENCOUNTER — Other Ambulatory Visit: Payer: Self-pay | Admitting: Cardiovascular Disease

## 2023-04-29 NOTE — Telephone Encounter (Signed)
 last office visit: 05/29/22 with plan to f/u in 12 months. next office visit: 05/30/23

## 2023-05-13 ENCOUNTER — Ambulatory Visit
Admission: RE | Admit: 2023-05-13 | Discharge: 2023-05-13 | Disposition: A | Discharge status: Home / Self Care | Payer: Medicare Other | Source: Ambulatory Visit | Attending: Internal Medicine | Admitting: Internal Medicine

## 2023-05-13 DIAGNOSIS — Z87891 Personal history of nicotine dependence: Secondary | ICD-10-CM | POA: Insufficient documentation

## 2023-05-13 DIAGNOSIS — Z122 Encounter for screening for malignant neoplasm of respiratory organs: Secondary | ICD-10-CM | POA: Insufficient documentation

## 2023-05-20 ENCOUNTER — Other Ambulatory Visit: Payer: Self-pay | Admitting: Acute Care

## 2023-05-20 DIAGNOSIS — Z122 Encounter for screening for malignant neoplasm of respiratory organs: Secondary | ICD-10-CM

## 2023-05-20 DIAGNOSIS — Z87891 Personal history of nicotine dependence: Secondary | ICD-10-CM

## 2023-05-29 ENCOUNTER — Other Ambulatory Visit: Payer: Self-pay | Admitting: Cardiovascular Disease

## 2023-05-30 ENCOUNTER — Encounter: Payer: Self-pay | Admitting: Cardiovascular Disease

## 2023-05-30 ENCOUNTER — Ambulatory Visit: Payer: Medicare Other | Attending: Cardiovascular Disease | Admitting: Cardiovascular Disease

## 2023-05-30 VITALS — BP 120/70 | HR 72 | Ht 64.0 in | Wt 157.2 lb

## 2023-05-30 DIAGNOSIS — E785 Hyperlipidemia, unspecified: Secondary | ICD-10-CM

## 2023-05-30 DIAGNOSIS — I2510 Atherosclerotic heart disease of native coronary artery without angina pectoris: Secondary | ICD-10-CM | POA: Diagnosis not present

## 2023-05-30 NOTE — Patient Instructions (Signed)
 Medication Instructions:  No changes *If you need a refill on your cardiac medications before your next appointment, please call your pharmacy*   Lab Work: None ordered If you have labs (blood work) drawn today and your tests are completely normal, you will receive your results only by: MyChart Message (if you have MyChart) OR A paper copy in the mail If you have any lab test that is abnormal or we need to change your treatment, we will call you to review the results.   Testing/Procedures: None ordered   Follow-Up: At [facility], you and your health needs are our priority.  As part of our continuing mission to provide you with exceptional heart care, we have created designated Provider Care Teams.  These Care Teams include your primary Cardiologist (physician) and Advanced Practice Providers (APPs -  Physician Assistants and Nurse Practitioners) who all work together to provide you with the care you need, when you need it.  We recommend signing up for the patient portal called "MyChart".  Sign up information is provided on this After Visit Summary.  MyChart is used to connect with patients for Virtual Visits (Telemedicine).  Patients are able to view lab/test results, encounter notes, upcoming appointments, etc.  Non-urgent messages can be sent to your provider as well.   To learn more about what you can do with MyChart, go to [email or web address].    Your next appointment:   12 month(s)  Provider:   You may see Dr. Kirke Corin or one of the following Advanced Practice Providers on your designated Care Team:   Nicolasa Ducking, NP Eula Listen, PA-C Cadence Fransico Michael, PA-C Charlsie Quest, NP Carlos Levering, NP

## 2023-05-30 NOTE — Progress Notes (Signed)
 Cardiology Office Note   Date:  05/30/2023   ID:  Matthew Arias, DOB 08-03-1955, MRN [id number]  PCP:  Fernande Ophelia JINNY DOUGLAS, MD  Cardiologist:   Deatrice Cage, MD   Chief Complaint  Patient presents with   Follow-up    Patient denies new or acute cardiac problems/concerns today.        History of Present Illness: Matthew Arias is a 68 y.o. male who presents for a follow-up visit regarding coronary artery disease. He has overall been healthy throughout his life and very active. He presented in 02/2016 with NSTEMI.  Cardiac catheterization showed an occluded first diagonal with mild disease elsewhere. There were faint collaterals and the vessel was overall 2 mm in diameter with ostial location. I elected to treat him medically. Ejection fraction was 50-55%.  He had COVID-19 infection in October 2020.  He continues to do very well with no recurrent angina.  No chest pain, shortness of breath or palpitations.  He has a home gym and exercises at home on the treadmill as well as heavy weightlifting.   Past Medical History:  Diagnosis Date   Coronary artery disease    Non-ST elevation myocardial infarction in November 2017. Cardiac catheterization showed occluded first diagonal with faint collaterals. EF was 50-55%. There was mild nonobstructive disease otherwise. He was treated medically.   Degenerative disc disease, lumbar    Hyperlipidemia    Lumbar degenerative disc disease    Pulmonary nodules     Past Surgical History:  Procedure Laterality Date   CARDIAC CATHETERIZATION N/A 03/12/2016   Procedure: Left Heart Cath and Coronary Angiography;  Surgeon: Deatrice DELENA Cage, MD;  Location: [facility] INVASIVE CV LAB;  Service: Cardiovascular;  Laterality: N/A;   CARPAL TUNNEL RELEASE     COLONOSCOPY     COLONOSCOPY WITH PROPOFOL  N/A 11/20/2017   Procedure: COLONOSCOPY WITH PROPOFOL ;  Surgeon: Viktoria Lamar DASEN, MD;  Location[facility] ENDOSCOPY;  Service: Endoscopy;  Laterality: N/A;   LUMBAR  LAMINECTOMY     TONSILLECTOMY       Current Outpatient Medications  Medication Sig Dispense Refill   aspirin  EC 81 MG EC tablet Take 1 tablet (81 mg total) by mouth daily.     icosapent  Ethyl (VASCEPA ) 1 g capsule TAKE 2 CAPSULES BY MOUTH TWICE DAILY WITH A MEAL 120 capsule 0   meclizine  (ANTIVERT ) 25 MG tablet Take 1 tablet (25 mg total) by mouth 3 (three) times daily as needed for dizziness. 30 tablet 0   nitroGLYCERIN  (NITROSTAT ) 0.4 MG SL tablet Place 1 tablet (0.4 mg total) under the tongue every 5 (five) minutes as needed for chest pain. 25 tablet 1   ondansetron  (ZOFRAN  ODT) 4 MG disintegrating tablet Take 1 tablet (4 mg total) by mouth every 8 (eight) hours as needed for nausea or vomiting. 20 tablet 0   rosuvastatin  (CRESTOR ) 20 MG tablet TAKE 1 TABLET(20 MG) BY MOUTH DAILY 90 tablet 3   albuterol (VENTOLIN HFA) 108 (90 Base) MCG/ACT inhaler Inhale 1-2 puffs into the lungs every 4 (four) hours as needed.  (Patient not taking: Reported on 05/30/2023)     No current facility-administered medications for this visit.    Allergies:   Atorvastatin  and Codeine    Social History:  The patient  reports that he quit smoking about 8 years ago. His smoking use included cigarettes. He started smoking about 53 years ago. He has a 45 pack-year smoking history. He has never used smokeless tobacco. He reports  current alcohol use. He reports that he does not use drugs.   Family History:  The patient's family history includes Arthritis in his mother; CAD in his father.    ROS:  Please see the history of present illness.   Otherwise, review of systems are positive for none.   All other systems are reviewed and negative.    PHYSICAL EXAM: VS:  BP 120/70 (BP Location: Left Arm, Patient Position: Sitting, Cuff Size: Normal)   Pulse 72   Ht 5' 4 (1.626 m)   Wt 157 lb 3.2 oz (71.3 kg)   SpO2 97%   BMI 26.98 kg/m  , BMI Body mass index is 26.98 kg/m. GEN: Well nourished, well developed, in no  acute distress  HEENT: normal  Neck: no JVD, carotid bruits, or masses Cardiac: RRR; no murmurs, rubs, or gallops,no edema  Respiratory:  clear to auscultation bilaterally, normal work of breathing GI: soft, nontender, nondistended, + BS MS: no deformity or atrophy  Skin: warm and dry, no rash Neuro:  Strength and sensation are intact Psych: euthymic mood, full affect   EKG:  EKG is  ordered today. EKG showed: Normal sinus rhythm with sinus arrhythmia Normal ECG When compared with ECG of 27-Feb-2019 16:20, No significant change was found    Recent Labs: No results found for requested labs within last 365 days.    Lipid Panel    Component Value Date/Time   CHOL 125 06/21/2017 0757   CHOL 157 04/30/2016 0757   TRIG 127 06/21/2017 0757   HDL 34 (L) 06/21/2017 0757   HDL 41 04/30/2016 0757   CHOLHDL 3.7 06/21/2017 0757   VLDL 25 06/21/2017 0757   LDLCALC 66 06/21/2017 0757   LDLCALC 94 04/30/2016 0757      Wt Readings from Last 3 Encounters:  05/30/23 157 lb 3.2 oz (71.3 kg)  05/29/22 160 lb 2 oz (72.6 kg)  06/01/21 155 lb 2 oz (70.4 kg)          No data to display            ASSESSMENT AND PLAN:  1. Coronary artery disease involving native coronary arteries without angina: He is doing extremely well with no anginal symptoms.   2. Hyperlipidemia: He has mixed hyperlipidemia and currently is on rosuvastatin  and Vascepa .  I reviewed most recent labs done in October 2024 which showed triglyceride of 142 and an LDL of 68.  Continue same medications.   Disposition:   FU with me in 12 months  Signed,  Deatrice Cage, MD  05/30/2023 9:22 AM    [facility]

## 2023-05-31 ENCOUNTER — Other Ambulatory Visit: Payer: Self-pay | Admitting: Cardiovascular Disease

## 2023-06-27 ENCOUNTER — Other Ambulatory Visit: Payer: Self-pay | Admitting: Cardiovascular Disease

## 2023-09-06 ENCOUNTER — Telehealth: Payer: Self-pay | Admitting: Cardiovascular Disease

## 2023-09-06 MED ORDER — NITROGLYCERIN 0.4 MG SL SUBL: 0.4000 mg | SUBLINGUAL_TABLET | SUBLINGUAL | 9 refills | Status: AC | PRN

## 2023-09-06 NOTE — Telephone Encounter (Signed)
 Called pt to inform him that we would not be able to refill medication meclizine , because it is not a cardiac medication and pt stated that he did not request a refill on that medication. I inform pt that we sent in a refill for his medication nitroglycerin  to his preferred pharmacy as requested. I advised pt that if he has any other problems, questions or concerns to give our office a call back. Pt verbalized understanding.

## 2023-09-06 NOTE — Telephone Encounter (Signed)
*  STAT* If patient is at the pharmacy, call can be transferred to refill team.   1. Which medications need to be refilled? (please list name of each medication and dose if known)  meclizine  (ANTIVERT ) 25 MG tablet Take 1 tablet (25 mg total) by mouth 3 (three) times daily as needed for dizziness.   nitroGLYCERIN  (NITROSTAT ) 0.4 MG SL tablet    2. Which pharmacy/location (including street and city if local pharmacy) is medication to be sent to? [id number] [place], [place] - [address] AT [facility] OF [address] [facility] [facility] Phone: [phone]  Fax: [phone]      3. Do they need a 30 day or 90 day supply? 30

## 2023-11-07 ENCOUNTER — Other Ambulatory Visit: Payer: Self-pay | Admitting: Cardiovascular Disease

## 2024-05-13 ENCOUNTER — Ambulatory Visit: Admission: RE | Admit: 2024-05-13 | Source: Ambulatory Visit

## 2024-05-14 ENCOUNTER — Ambulatory Visit
Admission: RE | Admit: 2024-05-14 | Discharge: 2024-05-14 | Disposition: A | Discharge status: Home / Self Care | Source: Ambulatory Visit | Attending: Acute Care | Admitting: Acute Care

## 2024-05-14 DIAGNOSIS — Z122 Encounter for screening for malignant neoplasm of respiratory organs: Secondary | ICD-10-CM | POA: Insufficient documentation

## 2024-05-14 DIAGNOSIS — Z87891 Personal history of nicotine dependence: Secondary | ICD-10-CM | POA: Diagnosis present

## 2024-05-19 ENCOUNTER — Other Ambulatory Visit: Payer: Self-pay | Admitting: Acute Care

## 2024-05-19 DIAGNOSIS — Z122 Encounter for screening for malignant neoplasm of respiratory organs: Secondary | ICD-10-CM

## 2024-05-19 DIAGNOSIS — Z87891 Personal history of nicotine dependence: Secondary | ICD-10-CM

## 2024-05-28 ENCOUNTER — Ambulatory Visit: Admitting: Cardiovascular Disease

## 2024-05-29 ENCOUNTER — Ambulatory Visit: Admitting: Cardiovascular Disease

## 2024-06-10 ENCOUNTER — Ambulatory Visit: Admitting: Cardiovascular Disease
